# Patient Record
Sex: Male | Born: 1960
Health system: Southern US, Community
[De-identification: ages and names within clinical notes are randomized; demographics above are authoritative.]

## PROBLEM LIST (undated history)

## (undated) DIAGNOSIS — I10 Essential (primary) hypertension: Secondary | ICD-10-CM

## (undated) DIAGNOSIS — Z87442 Personal history of urinary calculi: Secondary | ICD-10-CM

## (undated) DIAGNOSIS — E785 Hyperlipidemia, unspecified: Secondary | ICD-10-CM

## (undated) DIAGNOSIS — M549 Dorsalgia, unspecified: Secondary | ICD-10-CM

## (undated) DIAGNOSIS — M25569 Pain in unspecified knee: Secondary | ICD-10-CM

## (undated) DIAGNOSIS — E669 Obesity, unspecified: Secondary | ICD-10-CM

## (undated) DIAGNOSIS — D229 Melanocytic nevi, unspecified: Secondary | ICD-10-CM

## (undated) HISTORY — PX: TONSILLECTOMY: SUR1361

## (undated) HISTORY — DX: Dorsalgia, unspecified: M54.9

## (undated) HISTORY — DX: Essential (primary) hypertension: I10

## (undated) HISTORY — DX: Pain in unspecified knee: M25.569

## (undated) HISTORY — DX: Obesity, unspecified: E66.9

## (undated) HISTORY — DX: Personal history of urinary calculi: Z87.442

## (undated) HISTORY — PX: OTHER SURGICAL HISTORY: SHX169

## (undated) HISTORY — DX: Hyperlipidemia, unspecified: E78.5

---
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
## 2001-02-27 ENCOUNTER — Ambulatory Visit (HOSPITAL_COMMUNITY): Admission: RE | Admit: 2001-02-27 | Discharge: 2001-02-27 | Payer: Self-pay | Admitting: Gastroenterology

## 2001-07-07 ENCOUNTER — Encounter: Payer: Self-pay | Admitting: Urology

## 2001-07-07 ENCOUNTER — Encounter: Admission: RE | Admit: 2001-07-07 | Discharge: 2001-07-07 | Payer: Self-pay | Admitting: Urology

## 2001-07-14 ENCOUNTER — Encounter: Admission: RE | Admit: 2001-07-14 | Discharge: 2001-07-14 | Payer: Self-pay | Admitting: Urology

## 2001-07-14 ENCOUNTER — Encounter: Payer: Self-pay | Admitting: Urology

## 2002-11-06 ENCOUNTER — Ambulatory Visit (HOSPITAL_COMMUNITY): Admission: AD | Admit: 2002-11-06 | Discharge: 2002-11-06 | Payer: Self-pay | Admitting: Orthopedic Surgery

## 2002-11-06 ENCOUNTER — Encounter: Payer: Self-pay | Admitting: Orthopedic Surgery

## 2002-12-07 ENCOUNTER — Ambulatory Visit (HOSPITAL_COMMUNITY): Admission: RE | Admit: 2002-12-07 | Discharge: 2002-12-07 | Payer: Self-pay | Admitting: Orthopedic Surgery

## 2003-07-26 DIAGNOSIS — D229 Melanocytic nevi, unspecified: Secondary | ICD-10-CM

## 2003-07-26 HISTORY — DX: Melanocytic nevi, unspecified: D22.9

## 2006-03-23 ENCOUNTER — Emergency Department (HOSPITAL_COMMUNITY): Admission: EM | Admit: 2006-03-23 | Discharge: 2006-03-24 | Payer: Self-pay | Admitting: Emergency Medicine

## 2007-01-02 ENCOUNTER — Ambulatory Visit: Payer: Self-pay | Admitting: Internal Medicine

## 2007-01-02 LAB — CONVERTED CEMR LAB
ALT: 31 units/L (ref 0–40)
AST: 20 units/L (ref 0–37)
Albumin: 3.9 g/dL (ref 3.5–5.2)
Alkaline Phosphatase: 38 units/L — ABNORMAL LOW (ref 39–117)
BUN: 11 mg/dL (ref 6–23)
Basophils Absolute: 0.1 10*3/uL (ref 0.0–0.1)
Basophils Relative: 0.8 % (ref 0.0–1.0)
Bilirubin, Direct: 0.1 mg/dL (ref 0.0–0.3)
CO2: 30 meq/L (ref 19–32)
CRP, High Sensitivity: 2 (ref 0.00–5.00)
Calcium: 9.3 mg/dL (ref 8.4–10.5)
Chloride: 109 meq/L (ref 96–112)
Cholesterol: 230 mg/dL (ref 0–200)
Creatinine, Ser: 0.9 mg/dL (ref 0.4–1.5)
Direct LDL: 171.5 mg/dL
Eosinophils Absolute: 0.2 10*3/uL (ref 0.0–0.6)
Eosinophils Relative: 3.7 % (ref 0.0–5.0)
GFR calc Af Amer: 116 mL/min
GFR calc non Af Amer: 96 mL/min
Glucose, Bld: 113 mg/dL — ABNORMAL HIGH (ref 70–99)
HCT: 41.3 % (ref 39.0–52.0)
HDL: 36.2 mg/dL — ABNORMAL LOW (ref >39.0)
Hemoglobin: 14.1 g/dL (ref 13.0–17.0)
Lymphocytes Relative: 29.1 % (ref 12.0–46.0)
MCHC: 34.2 g/dL (ref 30.0–36.0)
MCV: 85.6 fL (ref 78.0–100.0)
Monocytes Absolute: 0.5 10*3/uL (ref 0.2–0.7)
Monocytes Relative: 7.2 % (ref 3.0–11.0)
Neutro Abs: 3.7 10*3/uL (ref 1.4–7.7)
Neutrophils Relative %: 59.2 % (ref 43.0–77.0)
Platelets: 271 10*3/uL (ref 150–400)
Potassium: 4.6 meq/L (ref 3.5–5.1)
RBC: 4.83 M/uL (ref 4.22–5.81)
RDW: 12.7 % (ref 11.5–14.6)
Sodium: 145 meq/L (ref 135–145)
TSH: 1.22 microintl units/mL (ref 0.35–5.50)
Total Bilirubin: 0.7 mg/dL (ref 0.3–1.2)
Total CHOL/HDL Ratio: 6.4
Total Protein: 7.3 g/dL (ref 6.0–8.3)
Triglycerides: 138 mg/dL (ref 0–149)
VLDL: 28 mg/dL (ref 0–40)
WBC: 6.4 10*3/uL (ref 4.5–10.5)

## 2007-05-01 ENCOUNTER — Ambulatory Visit: Payer: Self-pay | Admitting: Internal Medicine

## 2007-05-01 LAB — CONVERTED CEMR LAB: Streptococcus, Group A Screen (Direct): NEGATIVE

## 2007-06-07 IMAGING — CT CT ABDOMEN W/O CM
2 of 3 series · 17 of 46 positions shown, 19 images · IV contrast (agent unspecified)
Comparison: none

CLINICAL DATA: Right lower quadrant pain. Rule out renal stone.
 ABDOMEN CT WITHOUT CONTRAST:
TECHNIQUE: Multidetector CT imaging of the abdomen was performed following the standard protocol without IV contrast.
TECHNIQUE: Multidetector CT imaging of the pelvis was performed following the standard protocol without IV contrast.

[Series 2: stone proto 5.0 b31f · axial · 0.79mm/px · z∈[+772,+1152]mm · 14 of 88 slices shown, 16 images]
[im 6/88  soft-tissue]
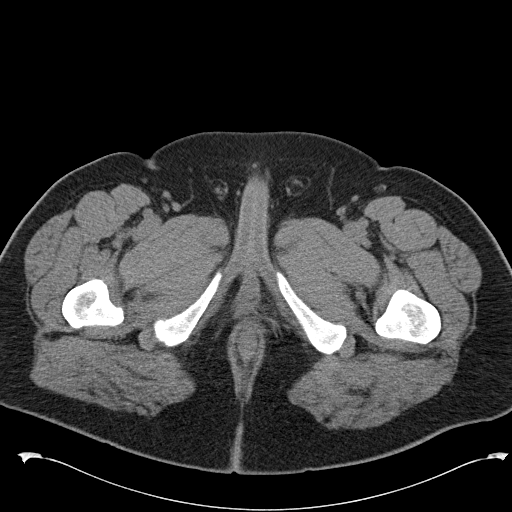
[im 6/88  bone]
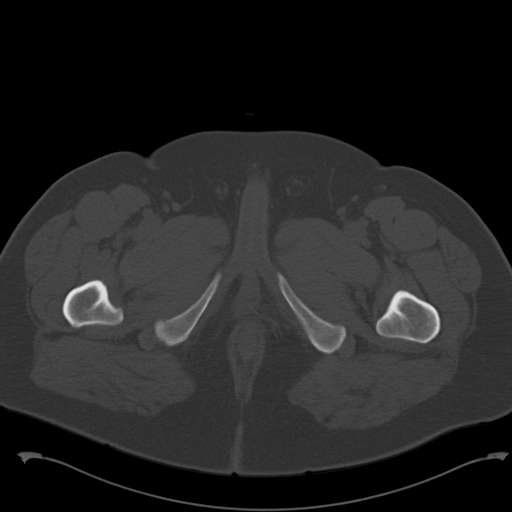
[im 12/88  soft-tissue]
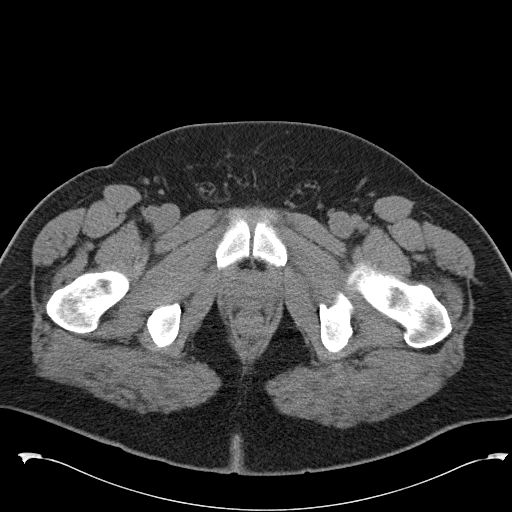
[im 17/88  soft-tissue]
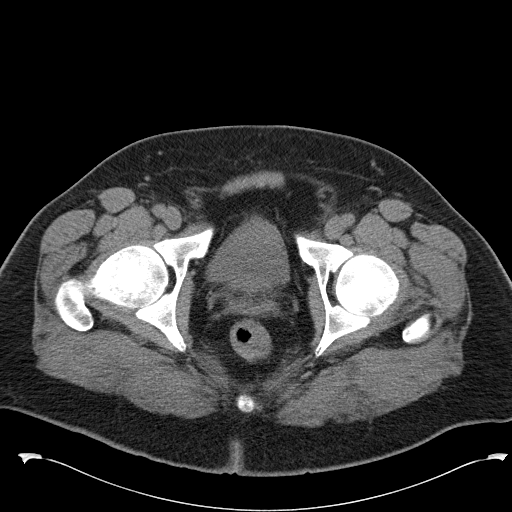
[im 23/88  soft-tissue]
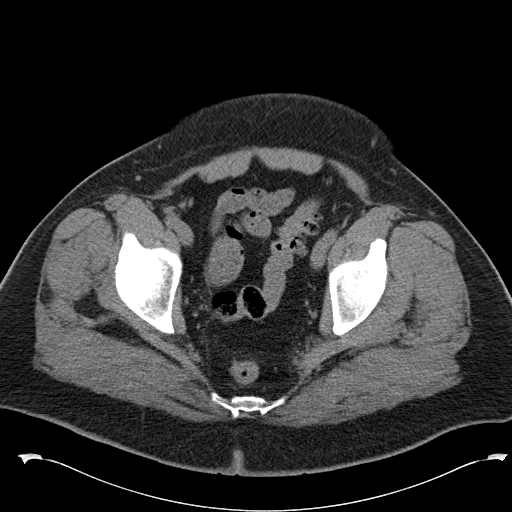
[im 29/88  soft-tissue]
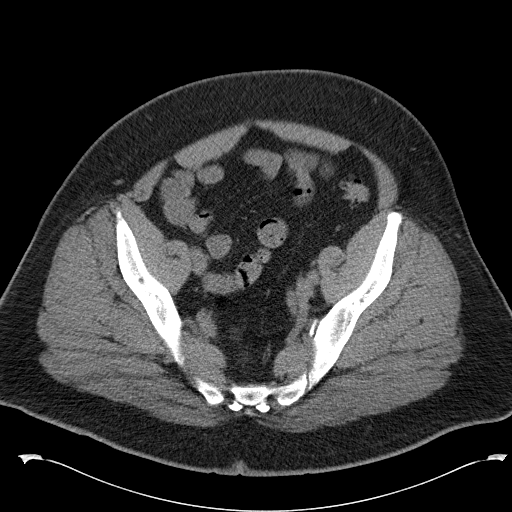
[im 34/88  soft-tissue]
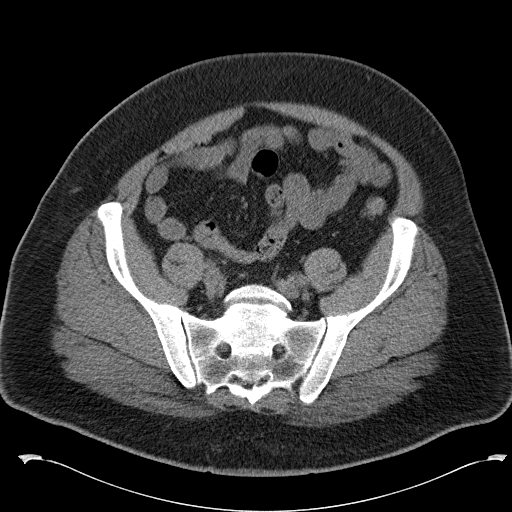
[im 40/88  soft-tissue]
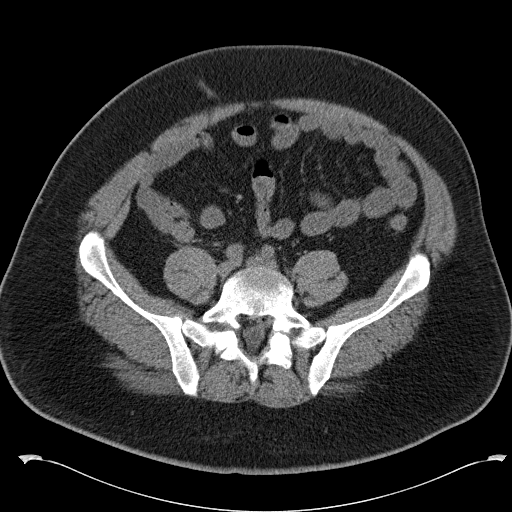
[im 48/88  soft-tissue]
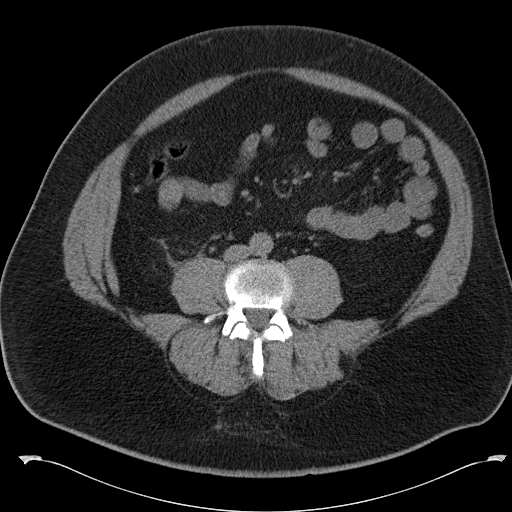
[im 54/88  soft-tissue]
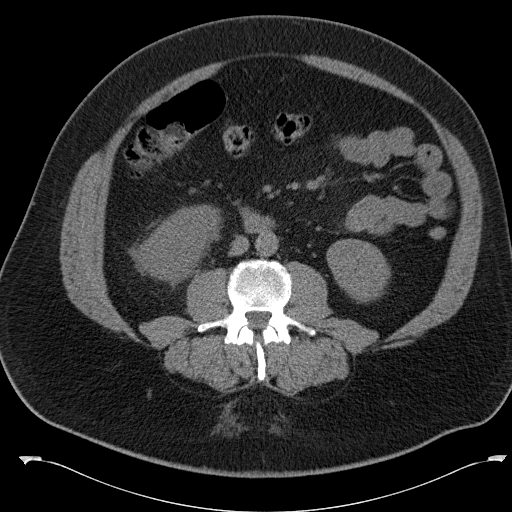
[im 54/88  bone]
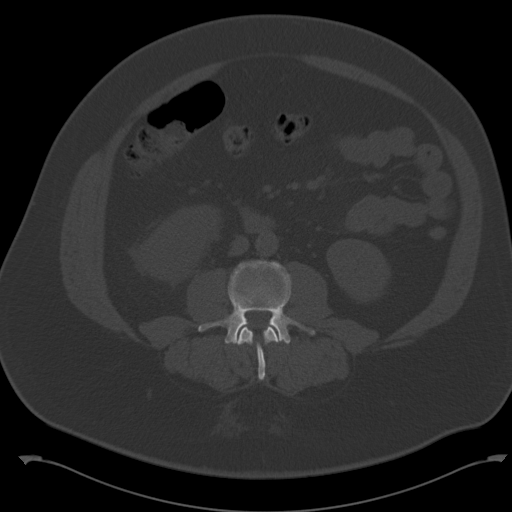
[im 59/88  soft-tissue]
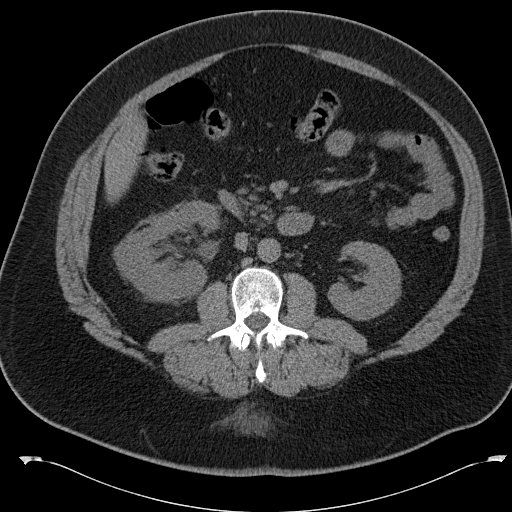
[im 65/88  soft-tissue]
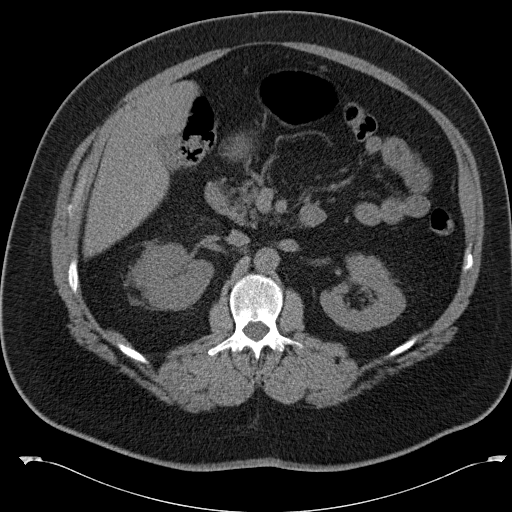
[im 71/88  soft-tissue]
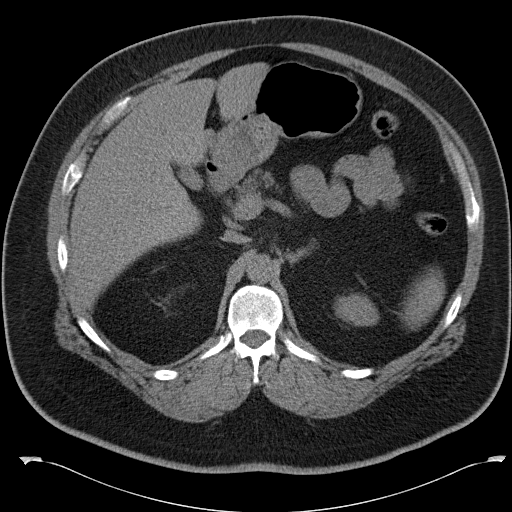
[im 76/88  soft-tissue]
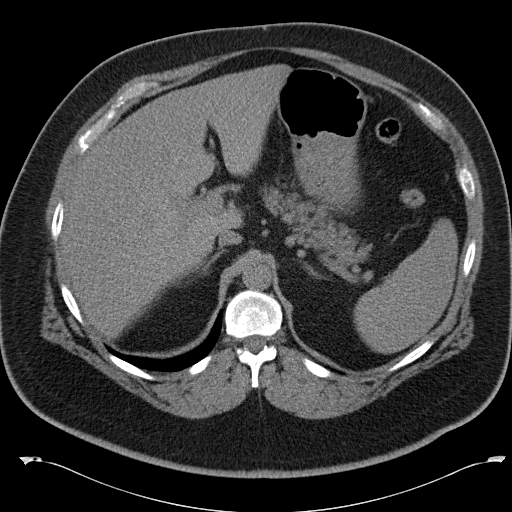
[im 82/88  soft-tissue]
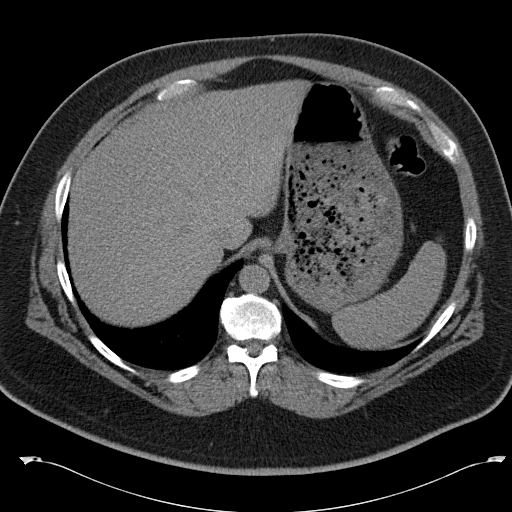

[Series 3: stone proto 2.0 spo cor · coronal · 0.85mm/px · 3 of 135 slices shown]
[im 45/135  soft-tissue]
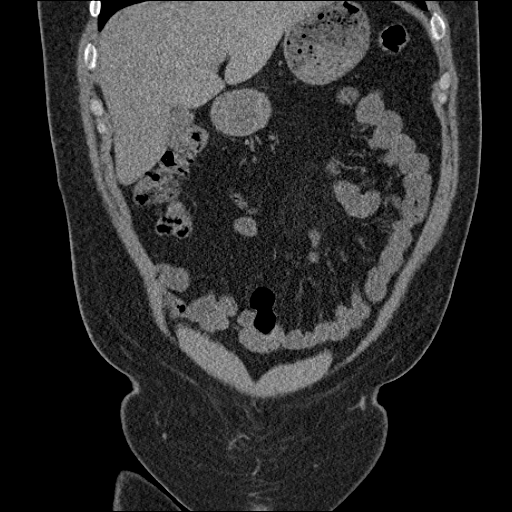
[im 60/135  soft-tissue]
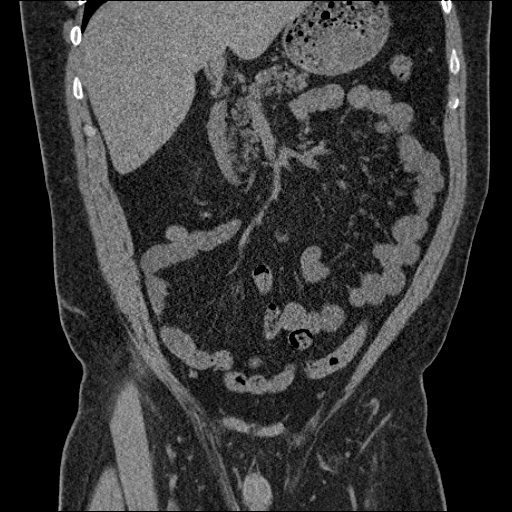
[im 75/135  soft-tissue]
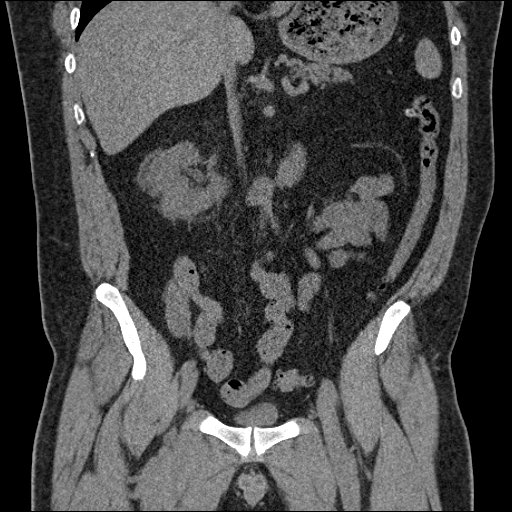

[17 of 46 positions shown; findings below may reference images not displayed]

FINDINGS: Lung bases clear.
FINDINGS: The liver is normal in attenuation and morphology.  Gallbladder negative. Pancreas is normal. The spleen is negative. The adrenal glands are negative.  The left kidney is normal. There is moderate right-sided hydronephrosis with an obstructing stone in the mid ureter. Stone measures 3.5 mm (image 46). 
 Tiny stone within the upper pole of the right kidney is also noted measuring approximately 1 mm.  
 No adenopathy.
 Visualized bowel loops are negative. Review of the bone windows is unremarkable.
IMPRESSION: Right-sided hydronephrosis with a small 3.5 mm stone in the mid right ureter. 
 PELVIS CT WITHOUT CONTRAST:
FINDINGS: Urinary bladder is normal.  Calcifications are seen within the pancreas centrally.  The pelvic bowel loops are significant for diverticula affecting the sigmoid colon. No active diverticulitis.
 Review of the bone windows shows no lytic or sclerotic findings.
IMPRESSION: No acute findings.

## 2009-10-04 DIAGNOSIS — M549 Dorsalgia, unspecified: Secondary | ICD-10-CM | POA: Insufficient documentation

## 2009-10-04 DIAGNOSIS — Z87442 Personal history of urinary calculi: Secondary | ICD-10-CM | POA: Insufficient documentation

## 2009-10-04 DIAGNOSIS — M25569 Pain in unspecified knee: Secondary | ICD-10-CM | POA: Insufficient documentation

## 2009-10-04 DIAGNOSIS — E785 Hyperlipidemia, unspecified: Secondary | ICD-10-CM | POA: Insufficient documentation

## 2009-10-05 ENCOUNTER — Ambulatory Visit: Payer: Self-pay | Admitting: Internal Medicine

## 2009-10-05 DIAGNOSIS — K219 Gastro-esophageal reflux disease without esophagitis: Secondary | ICD-10-CM | POA: Insufficient documentation

## 2009-10-05 LAB — CONVERTED CEMR LAB
ALT: 38 units/L (ref 0–53)
AST: 24 units/L (ref 0–37)
Albumin: 3.7 g/dL (ref 3.5–5.2)
Alkaline Phosphatase: 46 units/L (ref 39–117)
BUN: 8 mg/dL (ref 6–23)
Basophils Absolute: 0 10*3/uL (ref 0.0–0.1)
Basophils Relative: 0.7 % (ref 0.0–3.0)
Bilirubin Urine: NEGATIVE
Bilirubin, Direct: 0.1 mg/dL (ref 0.0–0.3)
CO2: 31 meq/L (ref 19–32)
CRP, High Sensitivity: 4.8 (ref 0.00–5.00)
Calcium: 9.4 mg/dL (ref 8.4–10.5)
Chloride: 106 meq/L (ref 96–112)
Cholesterol: 219 mg/dL — ABNORMAL HIGH (ref 0–200)
Creatinine, Ser: 0.9 mg/dL (ref 0.4–1.5)
Direct LDL: 170.1 mg/dL
Eosinophils Absolute: 0.2 10*3/uL (ref 0.0–0.7)
Eosinophils Relative: 3.5 % (ref 0.0–5.0)
GFR calc non Af Amer: 94.67 mL/min (ref >60)
Glucose, Bld: 109 mg/dL — ABNORMAL HIGH (ref 70–99)
HCT: 42.2 % (ref 39.0–52.0)
HDL: 34.7 mg/dL — ABNORMAL LOW (ref >39.00)
Hemoglobin, Urine: NEGATIVE
Hemoglobin: 13.9 g/dL (ref 13.0–17.0)
Ketones, ur: NEGATIVE mg/dL
Leukocytes, UA: NEGATIVE
Lymphocytes Relative: 26 % (ref 12.0–46.0)
Lymphs Abs: 1.5 10*3/uL (ref 0.7–4.0)
MCHC: 32.9 g/dL (ref 30.0–36.0)
MCV: 89.3 fL (ref 78.0–100.0)
Monocytes Absolute: 0.5 10*3/uL (ref 0.1–1.0)
Monocytes Relative: 8 % (ref 3.0–12.0)
Neutro Abs: 3.6 10*3/uL (ref 1.4–7.7)
Neutrophils Relative %: 61.8 % (ref 43.0–77.0)
Nitrite: NEGATIVE
Platelets: 242 10*3/uL (ref 150.0–400.0)
Potassium: 4.8 meq/L (ref 3.5–5.1)
RBC: 4.72 M/uL (ref 4.22–5.81)
RDW: 12.5 % (ref 11.5–14.6)
Sodium: 143 meq/L (ref 135–145)
Specific Gravity, Urine: 1.015 (ref 1.000–1.030)
TSH: 1.36 microintl units/mL (ref 0.35–5.50)
Total Bilirubin: 0.7 mg/dL (ref 0.3–1.2)
Total CHOL/HDL Ratio: 6
Total Protein, Urine: NEGATIVE mg/dL
Total Protein: 7.3 g/dL (ref 6.0–8.3)
Triglycerides: 94 mg/dL (ref 0.0–149.0)
Urine Glucose: NEGATIVE mg/dL
Urobilinogen, UA: 0.2 (ref 0.0–1.0)
VLDL: 18.8 mg/dL (ref 0.0–40.0)
WBC: 5.8 10*3/uL (ref 4.5–10.5)
pH: 7 (ref 5.0–8.0)

## 2009-10-13 ENCOUNTER — Telehealth: Payer: Self-pay | Admitting: Internal Medicine

## 2010-03-01 ENCOUNTER — Ambulatory Visit: Payer: Self-pay | Admitting: Internal Medicine

## 2010-03-01 ENCOUNTER — Inpatient Hospital Stay (HOSPITAL_COMMUNITY): Admission: AD | Admit: 2010-03-01 | Discharge: 2010-03-06 | Payer: Self-pay | Admitting: Specialist

## 2010-03-05 ENCOUNTER — Ambulatory Visit: Payer: Self-pay | Admitting: Infectious Diseases

## 2010-04-06 ENCOUNTER — Ambulatory Visit: Payer: Self-pay | Admitting: Internal Medicine

## 2010-06-06 ENCOUNTER — Encounter: Payer: Self-pay | Admitting: Internal Medicine

## 2010-06-06 ENCOUNTER — Encounter: Admission: RE | Admit: 2010-06-06 | Discharge: 2010-07-05 | Payer: Self-pay | Admitting: Internal Medicine

## 2010-10-11 ENCOUNTER — Ambulatory Visit
Admission: RE | Admit: 2010-10-11 | Discharge: 2010-10-11 | Payer: Self-pay | Source: Home / Self Care | Attending: Internal Medicine | Admitting: Internal Medicine

## 2010-10-11 ENCOUNTER — Other Ambulatory Visit: Payer: Self-pay | Admitting: Internal Medicine

## 2010-10-11 ENCOUNTER — Encounter: Payer: Self-pay | Admitting: Internal Medicine

## 2010-10-11 LAB — CBC WITH DIFFERENTIAL/PLATELET
Basophils Absolute: 0.1 10*3/uL (ref 0.0–0.1)
Basophils Relative: 1.1 % (ref 0.0–3.0)
Eosinophils Absolute: 0.2 10*3/uL (ref 0.0–0.7)
Eosinophils Relative: 3.4 % (ref 0.0–5.0)
HCT: 41 % (ref 39.0–52.0)
Hemoglobin: 13.9 g/dL (ref 13.0–17.0)
Lymphocytes Relative: 27.7 % (ref 12.0–46.0)
Lymphs Abs: 1.7 10*3/uL (ref 0.7–4.0)
MCHC: 33.9 g/dL (ref 30.0–36.0)
MCV: 87.9 fl (ref 78.0–100.0)
Monocytes Absolute: 0.5 10*3/uL (ref 0.1–1.0)
Monocytes Relative: 8 % (ref 3.0–12.0)
Neutro Abs: 3.7 10*3/uL (ref 1.4–7.7)
Neutrophils Relative %: 59.8 % (ref 43.0–77.0)
Platelets: 244 10*3/uL (ref 150.0–400.0)
RBC: 4.66 Mil/uL (ref 4.22–5.81)
RDW: 13.4 % (ref 11.5–14.6)
WBC: 6.2 10*3/uL (ref 4.5–10.5)

## 2010-10-11 LAB — URINALYSIS
Bilirubin Urine: NEGATIVE
Ketones, ur: NEGATIVE
Leukocytes, UA: NEGATIVE
Nitrite: NEGATIVE
Specific Gravity, Urine: 1.025 (ref 1.000–1.030)
Total Protein, Urine: NEGATIVE
Urine Glucose: NEGATIVE
Urobilinogen, UA: 0.2 (ref 0.0–1.0)
pH: 6 (ref 5.0–8.0)

## 2010-10-11 LAB — LIPID PANEL
Cholesterol: 205 mg/dL — ABNORMAL HIGH (ref 0–200)
HDL: 32.6 mg/dL — ABNORMAL LOW (ref >39.00)
Total CHOL/HDL Ratio: 6
Triglycerides: 91 mg/dL (ref 0.0–149.0)
VLDL: 18.2 mg/dL (ref 0.0–40.0)

## 2010-10-11 LAB — BASIC METABOLIC PANEL
BUN: 17 mg/dL (ref 6–23)
CO2: 27 mEq/L (ref 19–32)
Calcium: 9.3 mg/dL (ref 8.4–10.5)
Chloride: 104 mEq/L (ref 96–112)
Creatinine, Ser: 0.9 mg/dL (ref 0.4–1.5)
GFR: 91.93 mL/min (ref >60.00)
Glucose, Bld: 95 mg/dL (ref 70–99)
Potassium: 4.7 mEq/L (ref 3.5–5.1)
Sodium: 139 mEq/L (ref 135–145)

## 2010-10-11 LAB — LDL CHOLESTEROL, DIRECT: Direct LDL: 165.1 mg/dL

## 2010-10-11 LAB — HIGH SENSITIVITY CRP: CRP, High Sensitivity: 12.91 mg/L — ABNORMAL HIGH (ref 0.00–5.00)

## 2010-10-11 LAB — HEPATIC FUNCTION PANEL
ALT: 23 U/L (ref 0–53)
AST: 23 U/L (ref 0–37)
Albumin: 3.7 g/dL (ref 3.5–5.2)
Alkaline Phosphatase: 46 U/L (ref 39–117)
Bilirubin, Direct: 0.2 mg/dL (ref 0.0–0.3)
Total Bilirubin: 0.8 mg/dL (ref 0.3–1.2)
Total Protein: 7 g/dL (ref 6.0–8.3)

## 2010-10-11 LAB — TSH: TSH: 1.41 u[IU]/mL (ref 0.35–5.50)

## 2010-11-06 NOTE — Assessment & Plan Note (Signed)
Summary: knee & calf swollen/apc   Primary Provider/Referring Provider:  Dr. Sherene Sires  CC:  edema in right knee and calf with redness and pain x6days - pt states he was crawling around last week for work.  History of Present Illness: 50 yowm never smoker with morbid obesity  October 05, 2009 cpx no specific complaints  Mar 01, 2010--Presents for an acute office visit. Complains of edema in right knee and calf with redness and pain x6days - pt states he was crawling around last week for work.  Was seen at [facility] 3 days ago, fluid (pus)  drawn off knee. 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 Seen by [facility] started on Augmentin and fluid removed. Knee is still very swollen and red. Sore to touch. He is working but not bending or crawling around for last 3 days. Had initial body aches, chills, that stopped after on abx for 2 days. Has low grade temp today in office. Was suppose to be referred to ortho 2 days ago but says [facility] has not made referral yet and he is very worried about knee that the swelling is getting worse and now lower leg is swollen.  Denies chest pain, dyspnea, orthopnea, hemoptysis,  n/v/d, headache, rash.   Preventive Screening-Counseling & Management  Alcohol-Tobacco     Smoking Status: never  Medications Prior to Update: 1)  Glucosamine-Chondroitin  Caps (Glucosamine-Chondroit-Vit C-Mn) .Marland Kitchen.. 1 Once Daily 2)  Prilosec Otc 20 Mg Tbec (Omeprazole Magnesium) .Marland Kitchen.. 1 Once Daily 3)  Multivitamins  Tabs (Multiple Vitamin) .Marland Kitchen.. 1 Once Daily  Current Medications (verified): 1)  Glucosamine-Chondroitin  Caps (Glucosamine-Chondroit-Vit C-Mn) .Marland Kitchen.. 1 Once Daily 2)  Prilosec Otc 20 Mg Tbec (Omeprazole Magnesium) .Marland Kitchen.. 1 Once Daily 3)  Multivitamins  Tabs (Multiple Vitamin) .Marland Kitchen.. 1 Once Daily  Allergies (verified): 1)  ! Codeine 2)  ! Neosporin  Past History:  Past  Medical History: Last updated: 10/05/2009 KNEE PAIN, CHRONIC (ICD-719.46) BACK PAIN, CHRONIC (ICD-724.5) NEPHROLITHIASIS, HX OF (ICD-V13.01) OBESITY (ICD-278.00)    - Target wt  =   190 for BMI < 30  HYPERLIPIDEMIA (ICD-272.4)     - Target < 160 LDL with only risk factor male gender Diverticulosis...........................................Marland KitchenEdwards     - Colonoscopy 04/04/2006 Health Maintenance.................................Marland KitchenWert     - Td 12/2006     - CPX October 05, 2009   Family History: Last updated: 10/04/2009 Alcoholism/Cirrhosis- Father DM- Mother and Father Brain CA- Father  Social History: Last updated: 10/05/2009 [place] Never smoker No ETOH  Social History: Smoking Status:  never  Review of Systems      See HPI  Vital Signs:  Patient profile:   50 year old male Height:      67 inches Weight:      261 pounds BMI:     41.03 O2 Sat:      98 % on Room air Temp:     99.4 degrees F oral Pulse rate:   76 / minute BP sitting:   116 / 78  (left arm) Cuff size:   large  Vitals Entered By: Boone Master CNA/MA (Mar 01, 2010 2:49 PM)  O2 Flow:  Room air CC: edema in right knee and calf with redness and pain x6days - pt states he was crawling around last week for work Is Patient Diabetic? No Comments Medications reviewed with patient Daytime contact number verified with patient.  Boone Master CNA/MA  Mar 01, 2010 2:49 PM    Physical Exam  Additional Exam:  wt 264 October 05, 2009 >>261 Mar 01, 2010 2 obese amb wm nad HEENT: upper dentures and lower partials, nl turbinates, and orophanx. Nl external ear canals without cough reflex NECK :  without JVD/Nodes/TM/ nl carotid upstrokes bilaterally LUNGS: no acc muscle use, clear to A and P bilaterally without cough on insp or exp maneuvers CV:  RRR  no s3 or murmur or increase in P2, no edema  ABD:  soft and nontender with nl excursion in the supine position. No bruits or organomegaly, bowel sounds nl MS:   Right knee swollen w/ surrounding erthema, hot to touch, swelling along lower leg. pulses intact , no rash, neg homans sign.  SKIN: warm and dry without lesions   NEURO:  alert, approp, no deficits     Impression & Recommendations:  Problem # 1:  KNEE PAIN, ACUTE (ICD-719.46) Acute knee swelling/redness w/ underlying knee effusion s/p tap suspect infected.  no records from [facility] available ? unclear if cx were sent.  No significant improvement w/ 3 days of abx.  Will refer to ortho asap.  [facility] has agreed to see pt right away for evaluation /tx.  pt aware and agrees to office visit w/ ortho.  REC:  Fisnish Augmentin Wash knee gently w/ soap/water  Keep leg elevated  We are referring you to Ortho to evaluate your knee.  Please contact office for sooner follow up if symptoms do not improve or worsen   Orders: Est. Patient Level IV (16109)  Complete Medication List: 1)  Glucosamine-chondroitin Caps (Glucosamine-chondroit-vit c-mn) .Marland Kitchen.. 1 once daily 2)  Prilosec Otc 20 Mg Tbec (Omeprazole magnesium) .Marland Kitchen.. 1 once daily 3)  Multivitamins Tabs (Multiple vitamin) .Marland Kitchen.. 1 once daily  Other Orders: Orthopedic Referral (Ortho)  Patient Instructions: 1)  Fisnish Augmentin 2)  Wash knee gently w/ soap/water  3)  Keep leg elevated  4)  We are referring you to Ortho to evaluate your knee.  5)  Please contact office for sooner follow up if symptoms do not improve or worsen

## 2010-11-06 NOTE — Progress Notes (Signed)
Summary: results  Phone Note Call from Patient Call back at [phone]   Caller: Patient Call For: Anshika Pethtel Summary of Call: calling results from cpx Initial call taken by: Rickard Patience,  October 13, 2009 3:19 PM  Follow-up for Phone Call        pt advised per append to lab results. Carron Curie CMA  October 13, 2009 3:33 PM

## 2010-11-06 NOTE — Letter (Signed)
Summary: [employer]  [employer]   Imported By: Lester Ferguson 06/19/2010 08:45:09  _____________________________________________________________________  External Attachment:    Type:   Image     Comment:   External Document

## 2010-11-06 NOTE — Assessment & Plan Note (Signed)
Summary: Primary svc/ f/u ov, refer to nutrition   Primary Provider/Referring Provider:  Dr. Sherene Sires  CC:  6 month followup.  Pt states no complaints today.Marland Kitchen  History of Present Illness: 45  yowm never smoker with morbid obesity  October 05, 2009 cpx no specific complaints  Mar 01, 2010--Presents for an acute office visit. Complains of edema in right knee and calf with redness and pain x6days - pt states he was crawling around last week for work.  Was seen at [facility] 3 days ago, fluid (pus)  drawn off knee. 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 Seen by [facility] started on Augmentin and fluid removed. Knee is still very swollen and red. Sore to touch. He is working but not bending or crawling around for last 3 days. Had initial body aches, chills, that stopped after on abx for 2 days. Has low grade temp today in office. Was suppose to be referred to ortho 2 days ago but says [facility] has not made referral yet and he is very worried about knee that the swelling is getting worse and now lower leg is swollen.  rec ortho eval > admitted [facility]  April 06, 2010 6 month followup.  Pt states no complaints today. 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 no pain in knee, no fever or leg swelling.  Pt denies any significant sore throat, dysphagia, itching, sneezing,  nasal congestion or excess secretions,  chills, sweats, unintended wt loss, pleuritic or exertional cp, hempoptysis, change in activity tolerance  orthopnea pnd.    Current Medications (verified): 1)  Glucosamine-Chondroitin  Caps (Glucosamine-Chondroit-Vit C-Mn) .Marland Kitchen.. 1 Once Daily 2)  Prilosec Otc 20 Mg Tbec (Omeprazole Magnesium) .Marland Kitchen.. 1 Once Daily 3)  Multivitamins  Tabs (Multiple Vitamin) .Marland Kitchen.. 1 Once Daily 4)  Aspirin 325 Mg Tabs (Aspirin) .Marland Kitchen.. 1 Once Daily 5)  Vitamin C 500 Mg Tabs (Ascorbic Acid) .Marland Kitchen..  1 Once Daily  Allergies (verified): 1)  ! Codeine 2)  ! Neosporin  Past History:  Past Medical History: KNEE PAIN, CHRONIC (ICD-719.46)............................Marland KitchenBeane BACK PAIN, CHRONIC (ICD-724.5) NEPHROLITHIASIS, HX OF (ICD-V13.01) OBESITY (ICD-278.00)    - Target wt  =   190 for BMI < 30     - Refer to nutrition April 06, 2010  HYPERLIPIDEMIA (ICD-272.4)     - Target < 160 LDL with only risk factor male gender Diverticulosis...........................................Marland KitchenEdwards     - Colonoscopy 04/04/2006 Health Maintenance.................................Marland KitchenWert     - Td 12/2006     - CPX October 05, 2009   Vital Signs:  Patient profile:   50 year old male Weight:      258 pounds O2 Sat:      98 % on Room air Temp:     98.4 degrees F oral Pulse rate:   99 / minute BP sitting:   132 / 84  (left arm) Cuff size:   large  Vitals Entered By: Vernie Murders (April 06, 2010 9:43 AM)  O2 Flow:  Room air  Physical Exam  Additional Exam:  wt 264 October 05, 2009 >>261 Mar 01, 2010 > 258 April 06, 2010  obese amb wm nad HEENT: upper dentures and lower partials, nl turbinates, and orophanx. Nl external ear canals without cough reflex NECK :  without JVD/Nodes/TM/ nl carotid upstrokes bilaterally LUNGS: no acc muscle use,  clear to A and P bilaterally without cough on insp or exp maneuvers CV:  RRR  no s3 or murmur or increase in P2, no edema  ABD:  soft and nontender with nl excursion in the supine position. No bruits or organomegaly, bowel sounds nl MS:  Right knee nl exam.  SKIN: warm and dry without lesions  - right knee wound looks great    Impression & Recommendations:  Problem # 1:  KNEE PAIN, ACUTE (ICD-719.46)  resolved, f/u Dr Shelle Iron prn  Orders: Est. Patient Level III (16109)  Problem # 2:  GERD (ICD-530.81)  His updated medication list for this problem includes:    Prilosec Otc 20 Mg Tbec (Omeprazole magnesium) .Marland Kitchen... 1 once daily  Orders: Est. Patient  Level III (60454)  Problem # 3:  OBESITY (ICD-278.00)  Orders: Nutrition Referral (Nutrition) Est. Patient Level III (09811)   Weight control is a matter of calorie balance which needs to be tilted in the pt's favor by eating less and exercising more.  Specifically, I recommended  exercise at a level where pt  is short of breath but not out of breath 30 minutes daily. AT this point, esp since wife has same problem, I would strongly recommend  they  see a nutritionist with a food diary recorded for two weeks prior to the visit.     Medications Added to Medication List This Visit: 1)  Aspirin 325 Mg Tabs (Aspirin) .Marland Kitchen.. 1 once daily 2)  Vitamin C 500 Mg Tabs (Ascorbic acid) .Marland Kitchen.. 1 once daily  Patient Instructions: 1)  See Patient Care Coordinator before leaving for nutrition eval with a food diary for two weeks 2)  CPX Due after Jan 1

## 2010-11-08 NOTE — Assessment & Plan Note (Signed)
Summary: Primary Svc/ cpx     Primary Provider/Referring Provider:  Dr. Sherene Sires  CC:  cpx fasting.  History of Present Illness: 1  yowm never smoker with morbid obesity  October 05, 2009 cpx no specific complaints  Mar 01, 2010--Presents for an acute office visit. Complains of edema in right knee and calf with redness and pain x6days - referred to Dr Paula Libra for infected bursitis > required surgical drainage 5/27  October 11, 2010 cpx knee all better, no luck with wt loss, nocturia x 4 but drinking lots of fluids, no dysuria or hesitance. Pt denies any significant sore throat, dysphagia, itching, sneezing,  nasal congestion or excess secretions,  fever, chills, sweats, unintended wt loss, pleuritic or exertional cp, hempoptysis, change in activity tolerance  orthopnea pnd or leg swelling.  Current Medications (verified): 1)  Glucosamine-Chondroitin  Caps (Glucosamine-Chondroit-Vit C-Mn) .Marland Kitchen.. 1 Once Daily 2)  Prilosec Otc 20 Mg Tbec (Omeprazole Magnesium) .Marland Kitchen.. 1 Once Daily As Needed  Allergies (verified): 1)  ! Codeine 2)  ! Neosporin  Past History:  Past Medical History: KNEE PAIN, CHRONIC (ICD-719.46)............................Marland KitchenBeane     - R knee surgery Mar 02 2010 BACK PAIN, CHRONIC (ICD-724.5) NEPHROLITHIASIS, HX OF (ICD-V13.01) OBESITY (ICD-278.00)    - Target wt  =   190 for BMI < 30     -  nutrition evaluated 06/06/10 HYPERLIPIDEMIA (ICD-272.4)     - Target < 160 LDL with only risk factor male gender Diverticulosis...........................................Marland KitchenEdwards     - Colonoscopy 04/04/2006 Health Maintenance.................................Marland KitchenWert     - Td 12/2006     - CPX October 11, 2010   Past Surgical History: Mar 02 2010:   1. Irrigation and debridement, open, of infected prepatellar bursa.   2. Excision of prepatellar burs  Family History: Alcoholism/Cirrhosis- Father DM- Mother and Father Brain CA- Father no premature ascvd  Social  History: Psychologist, occupational active Never smoker No ETOH  Vital Signs:  Patient profile:   50 year old male Weight:      259 pounds O2 Sat:      99 % on Room air Temp:     97.7 degrees F oral Pulse rate:   64 / minute BP sitting:   136 / 80  (left arm) Cuff size:   large  Vitals Entered By: Vernie Murders (October 11, 2010 8:51 AM)  O2 Flow:  Room air  Physical Exam  Additional Exam:  wt 264 October 05, 2009 >>261 Mar 01, 2010 > 258 April 06, 2010 > 259 October 11, 2010  obese amb wm nad HEENT: upper dentures and lower partials, nl turbinates, and orophanx. Nl external ear canals without cough reflex NECK :  without JVD/Nodes/TM/ nl carotid upstrokes bilaterally LUNGS: no acc muscle use, clear to A and P bilaterally without cough on insp or exp maneuvers CV:  RRR  no s3 or murmur or increase in P2, no edema  ABD:  soft and nontender with nl excursion in the supine position. No bruits or organomegaly, bowel sounds nl MS:  Right knee nl exam.  SKIN: warm and dry without lesions   GU testes down bilaterally with no nodules or IH Rectal minimal bph, stool g neg    Cholesterol          [H]  205 mg/dL                   2-952     ATP III Classification  Desirable:  < 200 mg/dL                    Borderline High:  200 - 239 mg/dL               High:  > = 240 mg/dL   Triglycerides             91.0 mg/dL                  1.6-109.6     Normal:  <150 mg/dL     Borderline High:  045 - 199 mg/dL   HDL                  [L]  40.98 mg/dL                 >11.91   VLDL Cholesterol          18.2 mg/dL                  4.7-82.9  CHO/HDL Ratio:  CHD Risk                             6                    Men          Women     1/2 Average Risk     3.4          3.3     Average Risk          5.0          4.4     2X Average Risk          9.6          7.1     3X Average Risk          15.0          11.0                           Tests: (2) BMP (METABOL)   Sodium                    139 mEq/L                    135-145   Potassium                 4.7 mEq/L                   3.5-5.1   Chloride                  104 mEq/L                   96-112   Carbon Dioxide            27 mEq/L                    19-32   Glucose                   95 mg/dL                    56-21   BUN  17 mg/dL                    1-61   Creatinine                0.9 mg/dL                   0.9-6.0   Calcium                   9.3 mg/dL                   4.5-40.9   GFR                       91.93 mL/min                >60.00  Tests: (3) CBC Platelet w/Diff (CBCD)   White Cell Count          6.2 K/uL                    4.5-10.5   Red Cell Count            4.66 Mil/uL                 4.22-5.81   Hemoglobin                13.9 g/dL                   81.1-91.4   Hematocrit                41.0 %                      39.0-52.0   MCV                       87.9 fl                     78.0-100.0   MCHC                      33.9 g/dL                   78.2-95.6   RDW                       13.4 %                      11.5-14.6   Platelet Count            244.0 K/uL                  150.0-400.0   Neutrophil %              59.8 %                      43.0-77.0   Lymphocyte %              27.7 %                      12.0-46.0   Monocyte %                8.0 %  3.0-12.0   Eosinophils%              3.4 %                       0.0-5.0   Basophils %               1.1 %                       0.0-3.0   Neutrophill Absolute      3.7 K/uL                    1.4-7.7   Lymphocyte Absolute       1.7 K/uL                    0.7-4.0   Monocyte Absolute         0.5 K/uL                    0.1-1.0  Eosinophils, Absolute                             0.2 K/uL                    0.0-0.7   Basophils Absolute        0.1 K/uL                    0.0-0.1  Tests: (4) TSH (TSH)   FastTSH                   1.41 uIU/mL                 0.35-5.50  Tests: (5) Hepatic/Liver Function Panel (HEPATIC)   Total  Bilirubin           0.8 mg/dL                   1.6-1.0   Direct Bilirubin          0.2 mg/dL                   9.6-0.4   Alkaline Phosphatase      46 U/L                      39-117   AST                       23 U/L                      0-37   ALT                       23 U/L                      0-53   Total Protein             7.0 g/dL                    5.4-0.9   Albumin                   3.7 g/dL  3.5-5.2  Tests: (6) Full Range CRP (FCRP)   CRPH                 [H]  12.91 mg/L                  0.00-5.00     Note:  An elevated hs-CRP (>5 mg/L) should be repeated after 2 weeks to rule out recent infection or trauma.  Tests: (7) UDip Only (UDIP)   Color                     LT. YELLOW       RANGE:  Yellow;Lt. Yellow   Clarity                   CLEAR                       Clear   Specific Gravity          1.025                       1.000 - 1.030   Urine Ph                  6.0                         5.0-8.0   Protein                   NEGATIVE                    Negative   Urine Glucose             NEGATIVE                    Negative   Ketones                   NEGATIVE                    Negative   Urine Bilirubin           NEGATIVE                    Negative   Blood                     TRACE-LYSED                 Negative   Urobilinogen              0.2                         0.0 - 1.0   Leukocyte Esterace        NEGATIVE                    Negative   Nitrite                   NEGATIVE                    Negative  Tests: (8) Cholesterol LDL - Direct (DIRLDL)  Cholesterol LDL - Direct  165.1 mg/dL  Impression & Recommendations:  Problem # 1:  HYPERLIPIDEMIA (ICD-272.4) LDL 170 > 165 despite nutrition rx.   target LDL < 160 ,  note elevated CRP so could argue should be < 130 with RF male with pos crp  Problem # 2:  OBESITY (ICD-278.00)  Weight control is a matter of calorie balance which needs to be tilted in the pt's favor by  eating less and exercising more.  Specifically, I recommended  exercise at a level where pt  is short of breath but not out of breath 30 minutes daily.  If not losing weight on this program, I would strongly recommend pt see a nutritionist again with a new food diary recorded for two weeks prior to the visit.     Medications Added to Medication List This Visit: 1)  Prilosec Otc 20 Mg Tbec (Omeprazole magnesium) .Marland Kitchen.. 1 once daily as needed  Other Orders: EKG w/ Interpretation (93000) Est. Patient 40-64 years (16109) TLB-Lipid Panel (80061-LIPID) TLB-BMP (Basic Metabolic Panel-BMET) (80048-METABOL) TLB-CBC Platelet - w/Differential (85025-CBCD) TLB-TSH (Thyroid Stimulating Hormone) (84443-TSH) TLB-Hepatic/Liver Function Pnl (80076-HEPATIC) TLB-CRP-High Sensitivity (C-Reactive Protein) (86140-FCRP) TLB-Udip ONLY (81003-UDIP)  Patient Instructions: 1)  Weight control is simply a matter of calorie balance which needs to be tilted in your favor by eating less and exercising more.  To get the most out of exercise, you need to be continuously aware that you are short of breath, but never out of breath, for 30 minutes daily. As you improve, it will actually be easier for you to do the same amount in  30 minutes so always push to the level where you are short of breath.  If this does not result in gradual weight reduction,  I recommend  a nutritionist for a food diary 2)  Call [phone] for your results w/in next 3 days - if there's something important  I feel you need to know,  I'll be in touch with you directly.  3)  Return for yearly exam, sooner if needed

## 2010-12-24 LAB — BASIC METABOLIC PANEL
BUN: 12 mg/dL (ref 6–23)
CO2: 24 mEq/L (ref 19–32)
Calcium: 9.3 mg/dL (ref 8.4–10.5)
Chloride: 107 mEq/L (ref 96–112)
Creatinine, Ser: 0.84 mg/dL (ref 0.4–1.5)
GFR calc Af Amer: >60 mL/min (ref >60)
GFR calc non Af Amer: >60 mL/min (ref >60)
Glucose, Bld: 95 mg/dL (ref 70–99)
Potassium: 3.9 mEq/L (ref 3.5–5.1)
Sodium: 137 mEq/L (ref 135–145)

## 2010-12-24 LAB — CBC
HCT: 37 % — ABNORMAL LOW (ref 39.0–52.0)
HCT: 38.6 % — ABNORMAL LOW (ref 39.0–52.0)
HCT: 40.3 % (ref 39.0–52.0)
Hemoglobin: 12.8 g/dL — ABNORMAL LOW (ref 13.0–17.0)
Hemoglobin: 13.4 g/dL (ref 13.0–17.0)
MCHC: 33 g/dL (ref 30.0–36.0)
MCHC: 33 g/dL (ref 30.0–36.0)
MCHC: 33.1 g/dL (ref 30.0–36.0)
MCHC: 33.2 g/dL (ref 30.0–36.0)
MCV: 88.2 fL (ref 78.0–100.0)
MCV: 88.3 fL (ref 78.0–100.0)
MCV: 88.3 fL (ref 78.0–100.0)
MCV: 88.4 fL (ref 78.0–100.0)
MCV: 88.6 fL (ref 78.0–100.0)
Platelets: 272 10*3/uL (ref 150–400)
Platelets: 284 10*3/uL (ref 150–400)
RBC: 4.07 MIL/uL — ABNORMAL LOW (ref 4.22–5.81)
RBC: 4.19 MIL/uL — ABNORMAL LOW (ref 4.22–5.81)
RBC: 4.37 MIL/uL (ref 4.22–5.81)
RBC: 4.57 MIL/uL (ref 4.22–5.81)
RDW: 13 % (ref 11.5–15.5)
RDW: 13 % (ref 11.5–15.5)
RDW: 13.3 % (ref 11.5–15.5)
WBC: 13.5 10*3/uL — ABNORMAL HIGH (ref 4.0–10.5)
WBC: 8.7 10*3/uL (ref 4.0–10.5)

## 2010-12-24 LAB — BODY FLUID CULTURE

## 2010-12-24 LAB — ANAEROBIC CULTURE

## 2010-12-24 LAB — DIFFERENTIAL
Basophils Absolute: 0.2 10*3/uL — ABNORMAL HIGH (ref 0.0–0.1)
Eosinophils Relative: 3 % (ref 0–5)
Lymphocytes Relative: 17 % (ref 12–46)
Lymphs Abs: 1.5 10*3/uL (ref 0.7–4.0)
Neutro Abs: 6.1 10*3/uL (ref 1.7–7.7)

## 2010-12-24 LAB — HEPATIC FUNCTION PANEL
ALT: 20 U/L (ref 0–53)
AST: 16 U/L (ref 0–37)
Albumin: 3 g/dL — ABNORMAL LOW (ref 3.5–5.2)
Total Protein: 7.4 g/dL (ref 6.0–8.3)

## 2010-12-24 LAB — SEDIMENTATION RATE: Sed Rate: 103 mm/hr — ABNORMAL HIGH (ref 0–16)

## 2010-12-24 LAB — C-REACTIVE PROTEIN: CRP: 14.7 mg/dL — ABNORMAL HIGH (ref <0.6)

## 2011-02-19 NOTE — Assessment & Plan Note (Signed)
[facility]                             PULMONARY OFFICE NOTE   Terry Cummings, Terry Cummings                       MRN:          [id number]  DATE:05/01/2007                            DOB:          [birth year]    HISTORY OF PRESENT ILLNESS:  Patient is a 50 year old white male patient  of Dr. Thurston Hole who has a known history of morbid obesity, hyperlipidemia  and gastroesophageal reflux.  Presents today for a 3-day history of sore  throat, nasal congestion and nasal drip.  Patient denies any purulent  sputum, fever, chest pain, recent travel or antibiotic use.   PAST MEDICAL HISTORY:  Reviewed.   CURRENT MEDICATIONS:  Reviewed.   PHYSICAL EXAMINATION:  Patient is a pleasant male in no acute distress.  He is afebrile with stable vital signs.  O2 saturation is 99% on room  air.  HEENT:  Nasal mucosa is slightly pale.  Nontender sinuses.  Conjunctivae  not injected.  TMs are normal.  Posterior pharynx with some mild  erythema, no exudate is noted.  NECK:  Supple without adenopathy.  LUNGS:  Sounds are clear.  CARDIAC:  Regular rate.  ABDOMEN:  Morbidly obese and soft.  EXTREMITIES:  Warm without any edema.   IMPRESSION AND PLAN:  Acute pharyngitis with rhinitis flare, strep test  is pending.  Patient is to use salt water gargles, Magic Mouthwash as  needed.  Associated-in Zyrtec 10 mg at bedtime as needed for postnasal  drip symptoms and may use a nasal hygiene regimen with saline, Afrin x5  days.  May also use Mucinex DM for cough and congestion.  Patient may  also use Tylenol.  Patient is goingout of town on vacation.  He was  given a Z-Pak to have on hold in case symptoms worsen with purulent  sputum.  Patient will return back to our office as scheduled or sooner  if needed.      Rubye Oaks, NP  Electronically Signed      Terry Cummings. Terry Sires, MD, [facility]  Electronically Signed   TP/MedQ  DD: 05/01/2007  DT: 05/02/2007  Job #: [id number]

## 2011-02-22 NOTE — Assessment & Plan Note (Signed)
[facility]                             PULMONARY OFFICE NOTE   Terry Cummings, Terry Cummings                       MRN:          [id number]  DATE:01/02/2007                            DOB:          1961/05/19    PRIMARY SERVICE COMPREHENSIVE HEALTHCARE EVALUATION:   HISTORY:  A 50 year old, white male with a lifelong struggle with morbid  obesity with an ideal weight of 171 but not being able to make any  significant progression toward goal and complicated by hyperlipidemia.  He says he doesn't know why he is gaining weight, but admits that he  is not particularly good at watching either calories in or calories out  as previously recommended in discussions with me here.  He, however,  denies any excessive hypersomnolence, snoring, exertional chest pain,  claudication symptoms, orthopnea, PND, or leg swelling.  He denies any  heat or cold intolerance.   PAST MEDICAL HISTORY:  1. Hematuria with a history of nephrolithiasis in 2002, previously      followed by Dr. Isabel Caprice.  2. Chronic back pain and knee pain followed by Dr. Simonne Come.  3. History of rectal bleeding followed by Dr. Randa Evens with most recent      colonoscopy dated 2007, significant for diverticulosis and      hemorrhoids only.   ALLERGIES:  CODEINE CAUSES NAUSEA.   MEDICATIONS:  1. Glucosamine two daily.  2. Prilosec one daily.   SOCIAL HISTORY:  He works as a Psychologist, occupational.  He has never smoked.  He does  chew tobacco.  He gets no regular aerobic exercise.   FAMILY HISTORY:  Positive for diabetes in mother and father.  Negative  for colon cancer.  Positive for alcoholism and cirrhosis in his father.  Two brothers and sisters are all healthy with no weight issues.  The  only cancer in his family is brain cancer in his father.   REVIEW OF SYSTEMS:  Taken in detail on the worksheet.  Negative, except  as outlined above.  He does have occasional nocturia but denies any  decreased force or  stream.   PHYSICAL EXAMINATION:  GENERAL:  This is a massively obese, pleasant,  ambulatory, white male weighing 259 pounds, which was up about 16 pounds  from previous visit.  HEENT:  Oropharynx was clear.  Dentition was intact.  Nasal turbinates  normal.  NECK:  Supple without cervical adenopathy or tenderness.  Trachea was  midline.  No thyromegaly.  LUNGS:  Lung fields are completely clear bilaterally to auscultation and  percussion.  CARDIAC:  Regular rate and rhythm without murmur, gallop, rub.  ABDOMEN:  Soft, benign, no masses or tenderness.  EXTREMITIES:  Warm without calf tenderness, cyanosis, clubbing or edema.  Pedal pulses were present bilaterally and symmetric.  NEUROLOGIC:  No focal deficits, pathologic reflexes.  SKIN:  Warm and dry with no lesions.  MUSCULOSKELETAL:  Exam completely within normal limits.   LABORATORY DATA:  EKG was normal.  CBC was normal.  BMET revealed a  fasting blood sugar of 113.  Cholesterol was 230 with an LDL of 172, HDL  of 36.  TSH was normal.  CRP was only 2.0.   IMPRESSION:  1. Morbid obesity secondary to poor appreciation of caloric balance.      I spent extra time going over a calorie balance sheet with him      using the analogy of a bank account to help him understand that he      must burn more calories up and take less calories in, and that if      he wants to go to a nutritionist I would be happy to refer him with      his wife (who also has problems with weight control) but we will      see how he does on his own for the next three months at his      request.  Normal TSH.  2. Hyperlipidemia secondary to weight issues.  Note:  He has negative      family history but is male gender. A reasonable target would be      less than 160 LDL, which he could easily achieve with diet and      exercise.  Note that the CRP is relatively normal.  3. His degenerative arthritis is directly related to excess weight.  I      discussed the impact  of his weight on his joints over time if he is      not able to turn this problem around.  4. General health maintenance.  He was due a tetanus today, which was      given.  Follow up will be in three months to see how he is doing      with diet and exercise.     Charlaine Dalton. Sherene Sires, MD, [facility]  Electronically Signed    MBW/MedQ  DD: 01/05/2007  DT: 01/06/2007  Job #: [id number]

## 2011-02-22 NOTE — Op Note (Signed)
NAME:  Terry Cummings, Terry Cummings                          ACCOUNT NO.:  [id number]   MEDICAL RECORD NO.:  [id number]                   PATIENT TYPE:  AMB   LOCATION:  DAY                                  FACILITY:  [facility]   PHYSICIAN:  Marlowe Kays, M.D.               DATE OF BIRTH:  [birth year]   DATE OF PROCEDURE:  12/07/2002  DATE OF DISCHARGE:                                 OPERATIVE REPORT   PREOPERATIVE DIAGNOSES:  Medial shelf plica, right knee.   POSTOPERATIVE DIAGNOSES:  1. Medial shelf  plica, right knee.  2. 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  3. Grade 2 chondromalacia, medial femoral condyle, right knee.   OPERATION:  1. Right knee arthroscopy with one excision of medial shelf plica.  2. Debridement of medial facet of patella and medial femoral condyle.   SURGEON:  Marlowe Kays, M.D.   ASSISTANT:  __________   ANESTHESIA:  General.   INDICATIONS FOR PROCEDURE:  Symptoms all date from a twisting-type injury  sustained on March 27, 2003.  He has had persistent medial pain, mixed with  some lateral point pain.  An MRI of the knee demonstrated a medial shelf  plica, but no other significant abnormalities.  Because of the persistent  symptoms, he is here today for evaluation.  See the operative description  below for additional details and pathology.   DESCRIPTION OF PROCEDURE:  Under satisfactory general anesthesia, the  pneumatic tourniquet is placed and stabilizing the right knee.  It was  prepped with Duraprep and draped in a sterile field.  Ace wrap on left leg.  A superomedial saline inflow; first through an anterolateral portal in the  medial compartment and the joint is evaluated.  He had a large focus of  synovium, which was overlying the anteromedial meniscus, consisting of  injury, which I pictured and resected with a 3.5 shaver.  The remainder of  the medial compartment of his knee joint looked normal, with a normal-  looking medial  meniscus and medial femoral condyle.  Looking up the medial  gutter and suprapatellar area, he had a focus of grade 2/4 chondromalacia of  the medial area of the medial femoral condyle, consistent with his point of  injury and symptoms, and looking at the patellofemoral interval, he had a  large amount of reactive tissue and chondromalacia of the entire medial  facet of the patella.  This was associated with a large, firm plica.  With  flexion and extension of the knee, I could not definitely say that the plica  was getting entrapped, but the lesion on the medial femoral condyle and  lesion of the patella were adjacent to one another consistent with an injury  to both.  I then used the 3.5 shaver to smooth down the patella and the  medial femoral condyle.  I then used scissors to cut the plica and use  the  shaver to resect the remnants.  Then with reverse pulls, ACL, lateral joint,  lateral meniscus were all normal; in contrast, to medial joint that did not  have that focus of synovitis.   I then looked up the lateral gutter and suprapatellar area, and no  abnormalities were noted.  I checked to be sure that the plica had been  excised from this perspective.  The knee joint was then irrigated until  clear and arthroscope removed.  The two anterior portals were closed with 4-  0 nylon; 20 mL of 0.5% Marcaine with adrenaline and 4 mg  of morphine were then instilled into the arthroscopy __________ .  The knee  wrapped in Adaptic and a dry sterile dressing applied.  The tourniquet was  released.  He tolerated the procedure well and was taken to the recovery  room in satisfactory condition.  There were no known complications.                                               Marlowe Kays, M.D.    JA/MEDQ  D:  12/07/2002  T:  12/07/2002  Job:  [id number]

## 2011-02-22 NOTE — Procedures (Signed)
[facility]  Patient:    Terry Cummings, Terry Cummings                       MRN: [id number] Proc. Date: 02/27/01 Adm. Date:  [id number] Attending:  Orland Mustard CC:         Charlaine Dalton. Sherene Sires, M.D. [facility]   Procedure Report  PROCEDURE:  Colonoscopy.  MEDICATIONS:  Fentanyl 125 mcg, Versed 10 mg IV.  INDICATIONS:  The patient has had a history of an adenomatous polyps and has not undergone any follow-up.  This is done as a follow-up procedure.  DESCRIPTION OF PROCEDURE:  The procedure had been explained to the patient and consent obtained.  With the patient in the left lateral decubitus position, the adult Olympus video colonoscope was inserted and advanced under direct visualization.  The prep was excellent.  We were able to advance to the cecum. The ileocecal valve and appendiceal orifice were seen.  The scope was withdrawn.  The cecum, ascending colon, hepatic flexure, transverse colon, splenic flexure, descending, and sigmoid colon were seen.  No polyps or other lesions were seen.  The scope was withdrawn, and the patient tolerated the procedure well.  ASSESSMENT:  No evidence of further colon polyps.  PLAN:  We will recommend repeating in five years. DD:  02/27/01 TD:  02/27/01 Job: [id number]

## 2012-02-13 ENCOUNTER — Encounter: Payer: Self-pay | Admitting: Pulmonary Disease

## 2012-02-14 ENCOUNTER — Other Ambulatory Visit (INDEPENDENT_AMBULATORY_CARE_PROVIDER_SITE_OTHER): Payer: 59

## 2012-02-14 ENCOUNTER — Ambulatory Visit (INDEPENDENT_AMBULATORY_CARE_PROVIDER_SITE_OTHER)
Admission: RE | Admit: 2012-02-14 | Discharge: 2012-02-14 | Disposition: A | Discharge status: Home / Self Care | Payer: 59 | Source: Ambulatory Visit | Attending: Internal Medicine | Admitting: Internal Medicine

## 2012-02-14 ENCOUNTER — Ambulatory Visit (INDEPENDENT_AMBULATORY_CARE_PROVIDER_SITE_OTHER): Payer: 59 | Admitting: Internal Medicine

## 2012-02-14 ENCOUNTER — Encounter: Payer: Self-pay | Admitting: Internal Medicine

## 2012-02-14 DIAGNOSIS — Z0000 Encounter for general adult medical examination without abnormal findings: Secondary | ICD-10-CM

## 2012-02-14 DIAGNOSIS — E785 Hyperlipidemia, unspecified: Secondary | ICD-10-CM

## 2012-02-14 DIAGNOSIS — E669 Obesity, unspecified: Secondary | ICD-10-CM

## 2012-02-14 DIAGNOSIS — K219 Gastro-esophageal reflux disease without esophagitis: Secondary | ICD-10-CM

## 2012-02-14 LAB — CBC WITH DIFFERENTIAL/PLATELET
Basophils Absolute: 0 10*3/uL (ref 0.0–0.1)
HCT: 42.5 % (ref 39.0–52.0)
Lymphs Abs: 2.1 10*3/uL (ref 0.7–4.0)
Monocytes Relative: 8.7 % (ref 3.0–12.0)
Neutrophils Relative %: 59.6 % (ref 43.0–77.0)
Platelets: 265 10*3/uL (ref 150.0–400.0)
RDW: 13.6 % (ref 11.5–14.6)
WBC: 7.5 10*3/uL (ref 4.5–10.5)

## 2012-02-14 LAB — URINALYSIS
Ketones, ur: NEGATIVE
Leukocytes, UA: NEGATIVE
Nitrite: NEGATIVE
Specific Gravity, Urine: 1.025 (ref 1.000–1.030)
Total Protein, Urine: NEGATIVE
pH: 6 (ref 5.0–8.0)

## 2012-02-14 LAB — HEPATIC FUNCTION PANEL
AST: 21 U/L (ref 0–37)
Bilirubin, Direct: 0.1 mg/dL (ref 0.0–0.3)
Total Bilirubin: 0.5 mg/dL (ref 0.3–1.2)

## 2012-02-14 LAB — BASIC METABOLIC PANEL
BUN: 18 mg/dL (ref 6–23)
Calcium: 9.4 mg/dL (ref 8.4–10.5)
Creatinine, Ser: 0.9 mg/dL (ref 0.4–1.5)
GFR: 90.32 mL/min (ref >60.00)
Glucose, Bld: 108 mg/dL — ABNORMAL HIGH (ref 70–99)
Potassium: 4.5 mEq/L (ref 3.5–5.1)

## 2012-02-14 LAB — LIPID PANEL
HDL: 37.2 mg/dL — ABNORMAL LOW (ref >39.00)
Triglycerides: 122 mg/dL (ref 0.0–149.0)

## 2012-02-14 LAB — TSH: TSH: 1.53 u[IU]/mL (ref 0.35–5.50)

## 2012-02-14 NOTE — Assessment & Plan Note (Signed)
Adequate control on present rx, reviewed  

## 2012-02-14 NOTE — Assessment & Plan Note (Addendum)
-   Target wt = 190 for BMI < 30   Calorie balance reviewed

## 2012-02-14 NOTE — Progress Notes (Deleted)
dfads

## 2012-02-14 NOTE — Progress Notes (Signed)
  Subjective:    Patient ID: Terry Cummings, male    DOB: [birth year]   MRN: [id number]  HPI  12 yowm never smoker with morbid obesity complicated by hyperlipidemia and GERD  02/14/2012 ov/[name] CPX no luck with wt loss, has seen nutritionist with wife, not compliant with diet or exercise but considering getting a treadmill.  No sob, tia,claudication, hypersomnolence        Allergies   1) ! Codeine    Past Medical History:  KNEE PAIN, CHRONIC (ICD-719.46)  BACK PAIN, CHRONIC (ICD-724.5)  NEPHROLITHIASIS, HX OF (ICD-V13.01)  OBESITY (ICD-278.00)  - Target wt = 190 for BMI < 30  HYPERLIPIDEMIA (ICD-272.4)  - Target < 130 LDL with only risk factor male gender/Pos fm hx brother IHD  Diverticulosis...........................................Marland KitchenEdwards  - Colonoscopy 04/04/2006  Health Maintenance.................................Marland KitchenWert  - Td 12/2006  - CPX 02/14/2012    Family History:  Alcoholism/Cirrhosis- Father  DM- Mother and Father  Brain CA- Father  IHD Older brother   Social History:  Psychologist, occupational  Never smoker  No ETOH     Review of Systems  Constitutional: Negative for fever, chills, diaphoresis, activity change, appetite change, fatigue and unexpected weight change.  HENT: Positive for tinnitus. Negative for hearing loss, ear pain, nosebleeds, congestion, sore throat, facial swelling, rhinorrhea, sneezing, mouth sores, trouble swallowing, neck pain, neck stiffness, dental problem, voice change, postnasal drip, sinus pressure and ear discharge.   Eyes: Negative for photophobia, discharge, itching and visual disturbance.  Respiratory: Negative for apnea, cough, choking, chest tightness, shortness of breath, wheezing and stridor.   Cardiovascular: Negative for chest pain, palpitations and leg swelling.  Gastrointestinal: Negative for nausea, vomiting, abdominal pain, constipation, blood in stool and abdominal distention.  Genitourinary: Positive for frequency. Negative for  dysuria, urgency, hematuria, flank pain, decreased urine volume and difficulty urinating.  Musculoskeletal: Negative for myalgias, back pain, joint swelling, arthralgias and gait problem.  Skin: Negative for color change, pallor and rash.  Neurological: Negative for dizziness, tremors, seizures, syncope, speech difficulty, weakness, light-headedness, numbness and headaches.  Hematological: Negative for adenopathy. Does not bruise/bleed easily.  Psychiatric/Behavioral: Negative for confusion, sleep disturbance and agitation. The patient is not nervous/anxious.        Objective:   Physical Exam  Wt Readings from Last 3 Encounters:  02/14/12 270 lb (122.471 kg)  10/11/10 259 lb (117.482 kg)  04/06/10 258 lb (117.028 kg)       obese amb wm nad  HEENT: upper dentures and lower partials, nl turbinates, and orophanx. Nl external ear canals without cough reflex  NECK : without JVD/Nodes/TM/ nl carotid upstrokes bilaterally  LUNGS: no acc muscle use, clear to A and P bilaterally without cough on insp or exp maneuvers  CV: RRR no s3 or murmur or increase in P2, no edema  ABD: soft and nontender with nl excursion in the supine position. No bruits or organomegaly, bowel sounds nl  MS: warm without deformities, calf tenderness, cyanosis or clubbing  SKIN: warm and dry without lesions  NEURO: alert, approp, no deficits  GU uncirm, testes bilaterally descended  Rectal: mild bph, smooth guiac neg  CXR  02/14/2012 :   Borderline cardiac size. No pulmonary edema, pneumonia, or pleural effusion. Slight aortic ectasia. Osteophyte formation in the spine.      Assessment & Plan:

## 2012-02-14 NOTE — Assessment & Plan Note (Signed)
-   Target LDL < 130 with Pos fm hx/ male gender  Not Adequate control on present rx, reviewed  Options, wants to work on diet and ex first, continue a baby asa daily

## 2012-02-14 NOTE — Patient Instructions (Addendum)
Weight control is simply a matter of calorie balance which needs to be tilted in your favor by eating less and exercising more.  To get the most out of exercise, you need to be continuously aware that you are short of breath, but never out of breath, for 30 minutes daily. As you improve, it will actually be easier for you to do the same amount of exercise  in  30 minutes so always push to the level where you are short of breath.  If this does not result in gradual weight reduction then I strongly recommend you see a nutritionist with a food diary x 2 weeks so that we can work out a negative calorie balance which is universally effective in steady weight loss programs.  Think of your calorie balance like you do your bank account where in this case you want the balance to go down so you must take in less calories than you burn up.  It's just that simple:  Hard to do, but easy to understand.  Good luck!   Please remember to go to the lab and x-ray department downstairs for your tests - we will call you with the results when they are available.  Late Add Note LDL > 160 > rec recheck 3 months on diet/ex

## 2012-02-17 ENCOUNTER — Telehealth: Payer: Self-pay | Admitting: Internal Medicine

## 2012-02-17 NOTE — Telephone Encounter (Signed)
I spoke with patient about results and he verbalized understanding and had no questions 

## 2016-01-05 ENCOUNTER — Ambulatory Visit: Payer: Self-pay | Admitting: Internal Medicine

## 2016-01-26 ENCOUNTER — Encounter: Payer: Self-pay | Admitting: Internal Medicine

## 2016-01-26 ENCOUNTER — Other Ambulatory Visit (INDEPENDENT_AMBULATORY_CARE_PROVIDER_SITE_OTHER): Payer: 59

## 2016-01-26 ENCOUNTER — Ambulatory Visit (INDEPENDENT_AMBULATORY_CARE_PROVIDER_SITE_OTHER): Payer: 59 | Admitting: Internal Medicine

## 2016-01-26 VITALS — BP 126/68 | HR 58 | Temp 98.0°F | Resp 18 | Ht 68.0 in | Wt 252.0 lb

## 2016-01-26 DIAGNOSIS — E785 Hyperlipidemia, unspecified: Secondary | ICD-10-CM

## 2016-01-26 DIAGNOSIS — Z1159 Encounter for screening for other viral diseases: Secondary | ICD-10-CM

## 2016-01-26 DIAGNOSIS — J309 Allergic rhinitis, unspecified: Secondary | ICD-10-CM | POA: Insufficient documentation

## 2016-01-26 DIAGNOSIS — Z0000 Encounter for general adult medical examination without abnormal findings: Secondary | ICD-10-CM

## 2016-01-26 DIAGNOSIS — E669 Obesity, unspecified: Secondary | ICD-10-CM | POA: Diagnosis not present

## 2016-01-26 DIAGNOSIS — I10 Essential (primary) hypertension: Secondary | ICD-10-CM | POA: Insufficient documentation

## 2016-01-26 DIAGNOSIS — J301 Allergic rhinitis due to pollen: Secondary | ICD-10-CM

## 2016-01-26 LAB — COMPREHENSIVE METABOLIC PANEL
ALT: 25 U/L (ref 0–53)
AST: 21 U/L (ref 0–37)
Albumin: 4.2 g/dL (ref 3.5–5.2)
Alkaline Phosphatase: 44 U/L (ref 39–117)
BUN: 23 mg/dL (ref 6–23)
CO2: 29 meq/L (ref 19–32)
Calcium: 9.8 mg/dL (ref 8.4–10.5)
Chloride: 104 mEq/L (ref 96–112)
Creatinine, Ser: 1.59 mg/dL — ABNORMAL HIGH (ref 0.40–1.50)
GFR: 47.94 mL/min — AB (ref >60.00)
GLUCOSE: 115 mg/dL — AB (ref 70–99)
POTASSIUM: 4.4 meq/L (ref 3.5–5.1)
SODIUM: 140 meq/L (ref 135–145)
Total Bilirubin: 0.4 mg/dL (ref 0.2–1.2)
Total Protein: 7.6 g/dL (ref 6.0–8.3)

## 2016-01-26 LAB — HEPATITIS C ANTIBODY: HCV AB: NEGATIVE

## 2016-01-26 LAB — LIPID PANEL
CHOLESTEROL: 190 mg/dL (ref 0–200)
HDL: 34.2 mg/dL — ABNORMAL LOW (ref >39.00)
LDL Cholesterol: 128 mg/dL — ABNORMAL HIGH (ref 0–99)
NONHDL: 155.4
Total CHOL/HDL Ratio: 6
Triglycerides: 138 mg/dL (ref 0.0–149.0)
VLDL: 27.6 mg/dL (ref 0.0–40.0)

## 2016-01-26 LAB — HIV ANTIBODY (ROUTINE TESTING W REFLEX): HIV 1&2 Ab, 4th Generation: NONREACTIVE

## 2016-01-26 MED ORDER — CETIRIZINE HCL 10 MG PO TABS: 10.0000 mg | ORAL_TABLET | Freq: Every day | ORAL | Status: DC

## 2016-01-26 NOTE — Progress Notes (Signed)
Pre visit review using our clinic review tool, if applicable. No additional management support is needed unless otherwise documented below in the visit note. 

## 2016-01-26 NOTE — Assessment & Plan Note (Signed)
Checking lipid panel and adjust as needed. On pravastatin 20 mg daily without side effects.

## 2016-01-26 NOTE — Patient Instructions (Addendum)
We have recommended that you try zyrtec (also called cetirizine) 10 mg for the allergies that you can take while the pollen is out. It does take about 2-3 days to work fully. Do not get zyrtec-d as the d part can raise your blood pressure.  We are checking the blood work today and will call you back with the results.   Health Maintenance, Male A healthy lifestyle and preventative care can promote health and wellness.  Maintain regular health, dental, and eye exams.  Eat a healthy diet. Foods like vegetables, fruits, whole grains, low-fat dairy products, and lean protein foods contain the nutrients you need and are low in calories. Decrease your intake of foods high in solid fats, added sugars, and salt. Get information about a proper diet from your health care provider, if necessary.  Regular physical exercise is one of the most important things you can do for your health. Most adults should get at least 150 minutes of moderate-intensity exercise (any activity that increases your heart rate and causes you to sweat) each week. In addition, most adults need muscle-strengthening exercises on 2 or more days a week.   Maintain a healthy weight. The body mass index (BMI) is a screening tool to identify possible weight problems. It provides an estimate of body fat based on height and weight. Your health care provider can find your BMI and can help you achieve or maintain a healthy weight. For males 20 years and older:  A BMI below 18.5 is considered underweight.  A BMI of 18.5 to 24.9 is normal.  A BMI of 25 to 29.9 is considered overweight.  A BMI of 30 and above is considered obese.  Maintain normal blood lipids and cholesterol by exercising and minimizing your intake of saturated fat. Eat a balanced diet with plenty of fruits and vegetables. Blood tests for lipids and cholesterol should begin at age 19 and be repeated every 5 years. If your lipid or cholesterol levels are high, you are over age  37, or you are at high risk for heart disease, you may need your cholesterol levels checked more frequently.Ongoing high lipid and cholesterol levels should be treated with medicines if diet and exercise are not working.  If you smoke, find out from your health care provider how to quit. If you do not use tobacco, do not start.  Lung cancer screening is recommended for adults aged 87-80 years who are at high risk for developing lung cancer because of a history of smoking. A yearly low-dose CT scan of the lungs is recommended for people who have at least a 30-pack-year history of smoking and are current smokers or have quit within the past 15 years. A pack year of smoking is smoking an average of 1 pack of cigarettes a day for 1 year (for example, a 30-pack-year history of smoking could mean smoking 1 pack a day for 30 years or 2 packs a day for 15 years). Yearly screening should continue until the smoker has stopped smoking for at least 15 years. Yearly screening should be stopped for people who develop a health problem that would prevent them from having lung cancer treatment.  If you choose to drink alcohol, do not have more than 2 drinks per day. One drink is considered to be 12 oz (360 mL) of beer, 5 oz (150 mL) of wine, or 1.5 oz (45 mL) of liquor.  Avoid the use of street drugs. Do not share needles with anyone. Ask for help  if you need support or instructions about stopping the use of drugs.  High blood pressure causes heart disease and increases the risk of stroke. High blood pressure is more likely to develop in:  People who have blood pressure in the end of the normal range (100-139/85-89 mm Hg).  People who are overweight or obese.  People who are African American.  If you are 28-89 years of age, have your blood pressure checked every 3-5 years. If you are 60 years of age or older, have your blood pressure checked every year. You should have your blood pressure measured twice--once when  you are at a hospital or clinic, and once when you are not at a hospital or clinic. Record the average of the two measurements. To check your blood pressure when you are not at a hospital or clinic, you can use:  An automated blood pressure machine at a pharmacy.  A home blood pressure monitor.  If you are 55-69 years old, ask your health care provider if you should take aspirin to prevent heart disease.  Diabetes screening involves taking a blood sample to check your fasting blood sugar level. This should be done once every 3 years after age 73 if you are at a normal weight and without risk factors for diabetes. Testing should be considered at a younger age or be carried out more frequently if you are overweight and have at least 1 risk factor for diabetes.  Colorectal cancer can be detected and often prevented. Most routine colorectal cancer screening begins at the age of 26 and continues through age 20. However, your health care provider may recommend screening at an earlier age if you have risk factors for colon cancer. On a yearly basis, your health care provider may provide home test kits to check for hidden blood in the stool. A small camera at the end of a tube may be used to directly examine the colon (sigmoidoscopy or colonoscopy) to detect the earliest forms of colorectal cancer. Talk to your health care provider about this at age 45 when routine screening begins. A direct exam of the colon should be repeated every 5-10 years through age 17, unless early forms of precancerous polyps or small growths are found.  People who are at an increased risk for hepatitis B should be screened for this virus. You are considered at high risk for hepatitis B if:  You were born in a country where hepatitis B occurs often. Talk with your health care provider about which countries are considered high risk.  Your parents were born in a high-risk country and you have not received a shot to protect against  hepatitis B (hepatitis B vaccine).  You have HIV or AIDS.  You use needles to inject street drugs.  You live with, or have sex with, someone who has hepatitis B.  You are a man who has sex with other men (MSM).  You get hemodialysis treatment.  You take certain medicines for conditions like cancer, organ transplantation, and autoimmune conditions.  Hepatitis C blood testing is recommended for all people born from [birth year] through 1967 and any individual with known risk factors for hepatitis C.  Healthy men should no longer receive prostate-specific antigen (PSA) blood tests as part of routine cancer screening. Talk to your health care provider about prostate cancer screening.  Testicular cancer screening is not recommended for adolescents or adult males who have no symptoms. Screening includes self-exam, a health care provider exam, and other screening tests.  Consult with your health care provider about any symptoms you have or any concerns you have about testicular cancer.  Practice safe sex. Use condoms and avoid high-risk sexual practices to reduce the spread of sexually transmitted infections (STIs).  You should be screened for STIs, including gonorrhea and chlamydia if:  You are sexually active and are younger than 24 years.  You are older than 24 years, and your health care provider tells you that you are at risk for this type of infection.  Your sexual activity has changed since you were last screened, and you are at an increased risk for chlamydia or gonorrhea. Ask your health care provider if you are at risk.  If you are at risk of being infected with HIV, it is recommended that you take a prescription medicine daily to prevent HIV infection. This is called pre-exposure prophylaxis (PrEP). You are considered at risk if:  You are a man who has sex with other men (MSM).  You are a heterosexual man who is sexually active with multiple partners.  You take drugs by  injection.  You are sexually active with a partner who has HIV.  Talk with your health care provider about whether you are at high risk of being infected with HIV. If you choose to begin PrEP, you should first be tested for HIV. You should then be tested every 3 months for as long as you are taking PrEP.  Use sunscreen. Apply sunscreen liberally and repeatedly throughout the day. You should seek shade when your shadow is shorter than you. Protect yourself by wearing long sleeves, pants, a wide-brimmed hat, and sunglasses year round whenever you are outdoors.  Tell your health care provider of new moles or changes in moles, especially if there is a change in shape or color. Also, tell your health care provider if a mole is larger than the size of a pencil eraser.  A one-time screening for abdominal aortic aneurysm (AAA) and surgical repair of large AAAs by ultrasound is recommended for men aged 41-75 years who are current or former smokers.  Stay current with your vaccines (immunizations).   This information is not intended to replace advice given to you by your health care provider. Make sure you discuss any questions you have with your health care provider.   Document Released: 03/21/2008 Document Revised: 10/14/2014 Document Reviewed: 02/18/2011 Elsevier Interactive Patient Education [employer].

## 2016-01-26 NOTE — Progress Notes (Signed)
   Subjective:    Patient ID: Terry Cummings, male    DOB: [birth year], 55 y.o.   MRN: [id number]  HPI The patient is a new 55 YO man coming in for allergies. He has been having congestion, sore throat, headaches for the last 1 week. He has not tried anything due to having high blood pressure he was not sure what he could take. Overall stable and not worsening. No fevers or chills. No SOB or chest pains. No abdominal pain or other symptoms. No muscle aches.   PMH, [facility], social history reviewed and updated.   Review of Systems  Constitutional: Negative for fever, activity change, appetite change, fatigue and unexpected weight change.  HENT: Positive for congestion, postnasal drip and sore throat. Negative for ear discharge, ear pain, rhinorrhea, sinus pressure and trouble swallowing.   Eyes: Negative.   Respiratory: Negative for cough, chest tightness, shortness of breath and wheezing.   Cardiovascular: Negative for chest pain, palpitations and leg swelling.  Gastrointestinal: Negative for nausea, abdominal pain, diarrhea, constipation and abdominal distention.  Musculoskeletal: Negative for myalgias, back pain, arthralgias and neck pain.  Skin: Negative.   Neurological: Negative.   Psychiatric/Behavioral: Negative.       Objective:   Physical Exam  Constitutional: He is oriented to person, place, and time. He appears well-developed and well-nourished.  HENT:  Head: Normocephalic and atraumatic.  Oropharynx red with mild clear drainage, no crusting nasal turbinates, ear TM bilateral normal.   Eyes: EOM are normal.  Neck: Normal range of motion.  Cardiovascular: Normal rate and regular rhythm.   Pulmonary/Chest: Effort normal and breath sounds normal. No respiratory distress. He has no wheezes. He has no rales.  Abdominal: Soft. Bowel sounds are normal. He exhibits no distension. There is no tenderness. There is no rebound.  Musculoskeletal: He exhibits no edema.  Neurological: He is  alert and oriented to person, place, and time. Coordination normal.  Skin: Skin is warm and dry.  Psychiatric: He has a normal mood and affect.   Filed Vitals:   01/26/16 0902  BP: 126/68  Pulse: 58  Temp: 98 F (36.7 C)  TempSrc: Oral  Resp: 18  Height: 5\' 8"  (1.727 m)  Weight: 252 lb (114.306 kg)  SpO2: 98%      Assessment & Plan:

## 2016-01-26 NOTE — Assessment & Plan Note (Signed)
At goal on lisinopril/hctz. Reviewed recent CMP from [facility] and no indication for change. BP at goal and will continue.

## 2016-01-26 NOTE — Assessment & Plan Note (Signed)
Advised to use otc zyrtec and avoid allergens if possible. Wear mask while doing yard work. Advised to not take cold medicine or the d part of allergy medicine as they could elevate his blood pressure.

## 2016-01-26 NOTE — Assessment & Plan Note (Signed)
We talked about his BMI and he is not exercising right now and diet is mediocre. He does not wish any more intervention right now.

## 2016-02-01 ENCOUNTER — Telehealth: Payer: Self-pay

## 2016-02-02 NOTE — Telephone Encounter (Signed)
Amy he wants you to call him back to go over his labs. And he also is very worried about if his medications are going to be refilled in month when he runs out. Thank you. Please followup.

## 2016-02-02 NOTE — Telephone Encounter (Signed)
Patient aware of lab results.

## 2016-04-29 ENCOUNTER — Other Ambulatory Visit: Payer: Self-pay | Admitting: Internal Medicine

## 2016-07-24 ENCOUNTER — Ambulatory Visit (INDEPENDENT_AMBULATORY_CARE_PROVIDER_SITE_OTHER): Payer: 59 | Admitting: Internal Medicine

## 2016-07-24 ENCOUNTER — Encounter: Payer: Self-pay | Admitting: Internal Medicine

## 2016-07-24 ENCOUNTER — Other Ambulatory Visit (INDEPENDENT_AMBULATORY_CARE_PROVIDER_SITE_OTHER): Payer: 59

## 2016-07-24 VITALS — BP 110/76 | HR 64 | Temp 98.1°F | Resp 16 | Ht 68.0 in | Wt 260.0 lb

## 2016-07-24 DIAGNOSIS — I10 Essential (primary) hypertension: Secondary | ICD-10-CM | POA: Diagnosis not present

## 2016-07-24 DIAGNOSIS — M546 Pain in thoracic spine: Secondary | ICD-10-CM

## 2016-07-24 LAB — BASIC METABOLIC PANEL
BUN: 23 mg/dL (ref 6–23)
CHLORIDE: 103 meq/L (ref 96–112)
CO2: 28 meq/L (ref 19–32)
Calcium: 9.8 mg/dL (ref 8.4–10.5)
Creatinine, Ser: 1.56 mg/dL — ABNORMAL HIGH (ref 0.40–1.50)
GFR: 48.92 mL/min — ABNORMAL LOW (ref >60.00)
GLUCOSE: 95 mg/dL (ref 70–99)
POTASSIUM: 4.3 meq/L (ref 3.5–5.1)
Sodium: 137 mEq/L (ref 135–145)

## 2016-07-24 MED ORDER — CYCLOBENZAPRINE HCL 5 MG PO TABS: 5.0000 mg | ORAL_TABLET | Freq: Three times a day (TID) | ORAL | 0 refills | Status: DC | PRN

## 2016-07-24 NOTE — Patient Instructions (Signed)
We have sent in flexeril for the pain which you can take 1 pill three times a day as needed for pain.  This is likely from the muscles but we are checking the labs for the kidneys today as well.

## 2016-07-24 NOTE — Assessment & Plan Note (Signed)
Rx for flexeril today for the pain. Encouraged to still use tylenol if needed for pain. No red flags so no imaging indicated.

## 2016-07-24 NOTE — Progress Notes (Signed)
   Subjective:    Patient ID: Terry Cummings, male    DOB: 27-May-1961, 55 y.o.   MRN: [id number]  HPI The patient is a 55 YO man coming in for acute back pain. Started about 3 days ago. Came on during the day and gradually worsened. He has tried aleve for it without success. Pain is 5/10 and when worse 9/10. Gets stabs of pain maybe 1-2 times per day that is severe but mostly moderate pain. He has been able to do his usual activities. No overuse or injury recently that he recalls. No pain with inspiration and no abdominal pain, nausea, vomiting, diarrhea, constipation. He has had kidney stones in the past but this does not feel like that. No radiation of the pain to arm or legs and no numbness or weakness in the arms or legs.   Review of Systems  Constitutional: Positive for activity change. Negative for appetite change, chills, fatigue, fever and unexpected weight change.  Respiratory: Negative.   Cardiovascular: Negative.   Gastrointestinal: Negative.   Musculoskeletal: Positive for back pain and myalgias. Negative for arthralgias, gait problem, joint swelling, neck pain and neck stiffness.  Skin: Negative.   Neurological: Negative.       Objective:   Physical Exam  Constitutional: He is oriented to person, place, and time. He appears well-developed and well-nourished.  HENT:  Head: Normocephalic and atraumatic.  Eyes: EOM are normal.  Neck: Normal range of motion.  Cardiovascular: Normal rate and regular rhythm.   Pulmonary/Chest: Effort normal.  Abdominal: Soft. Bowel sounds are normal. He exhibits no distension. There is no tenderness. There is no rebound.  Musculoskeletal: He exhibits tenderness.  Pain in band across the thoracic spine without radiation. Moderate in intensity.   Neurological: He is alert and oriented to person, place, and time.  Skin: Skin is warm and dry.   Vitals:   07/24/16 1041  BP: 110/76  Pulse: 64  Resp: 16  Temp: 98.1 F (36.7 C)  TempSrc: Oral    SpO2: 98%  Weight: 260 lb (117.9 kg)  Height: 5\' 8"  (1.727 m)      Assessment & Plan:

## 2016-07-24 NOTE — Assessment & Plan Note (Signed)
Recheck BMP today as last creatinine was above prior baseline.

## 2016-07-24 NOTE — Progress Notes (Signed)
Pre visit review using our clinic review tool, if applicable. No additional management support is needed unless otherwise documented below in the visit note. 

## 2016-07-30 ENCOUNTER — Telehealth: Payer: Self-pay | Admitting: Internal Medicine

## 2016-07-30 NOTE — Telephone Encounter (Signed)
Pt request lab work result that was done 07/24/16. Please call him back

## 2016-07-31 NOTE — Telephone Encounter (Signed)
Advised patient that we will call him with his results as soon as they have been reviewed.

## 2016-08-09 ENCOUNTER — Other Ambulatory Visit: Payer: Self-pay | Admitting: Internal Medicine

## 2016-08-30 ENCOUNTER — Other Ambulatory Visit: Payer: Self-pay | Admitting: Internal Medicine

## 2016-09-23 ENCOUNTER — Other Ambulatory Visit (INDEPENDENT_AMBULATORY_CARE_PROVIDER_SITE_OTHER): Payer: 59

## 2016-09-23 ENCOUNTER — Encounter: Payer: Self-pay | Admitting: Internal Medicine

## 2016-09-23 ENCOUNTER — Ambulatory Visit (INDEPENDENT_AMBULATORY_CARE_PROVIDER_SITE_OTHER): Payer: 59 | Admitting: Internal Medicine

## 2016-09-23 VITALS — BP 132/72 | HR 78 | Temp 98.3°F | Resp 16 | Ht 68.0 in | Wt 254.0 lb

## 2016-09-23 DIAGNOSIS — Z0000 Encounter for general adult medical examination without abnormal findings: Secondary | ICD-10-CM

## 2016-09-23 DIAGNOSIS — R7989 Other specified abnormal findings of blood chemistry: Secondary | ICD-10-CM | POA: Diagnosis not present

## 2016-09-23 DIAGNOSIS — Z23 Encounter for immunization: Secondary | ICD-10-CM

## 2016-09-23 LAB — LDL CHOLESTEROL, DIRECT: Direct LDL: 148 mg/dL

## 2016-09-23 LAB — COMPREHENSIVE METABOLIC PANEL
ALT: 26 U/L (ref 0–53)
AST: 17 U/L (ref 0–37)
Albumin: 4.4 g/dL (ref 3.5–5.2)
Alkaline Phosphatase: 43 U/L (ref 39–117)
BILIRUBIN TOTAL: 0.4 mg/dL (ref 0.2–1.2)
BUN: 22 mg/dL (ref 6–23)
CO2: 27 meq/L (ref 19–32)
CREATININE: 1.73 mg/dL — AB (ref 0.40–1.50)
Calcium: 9.9 mg/dL (ref 8.4–10.5)
Chloride: 102 mEq/L (ref 96–112)
GFR: 43.39 mL/min — ABNORMAL LOW (ref >60.00)
GLUCOSE: 98 mg/dL (ref 70–99)
Potassium: 4.3 mEq/L (ref 3.5–5.1)
Sodium: 138 mEq/L (ref 135–145)
Total Protein: 7.5 g/dL (ref 6.0–8.3)

## 2016-09-23 LAB — CBC
HCT: 40.8 % (ref 39.0–52.0)
Hemoglobin: 13.6 g/dL (ref 13.0–17.0)
MCHC: 33.3 g/dL (ref 30.0–36.0)
MCV: 86.4 fl (ref 78.0–100.0)
PLATELETS: 265 10*3/uL (ref 150.0–400.0)
RBC: 4.72 Mil/uL (ref 4.22–5.81)
RDW: 13.1 % (ref 11.5–15.5)
WBC: 8 10*3/uL (ref 4.0–10.5)

## 2016-09-23 LAB — LIPID PANEL
Cholesterol: 220 mg/dL — ABNORMAL HIGH (ref 0–200)
HDL: 35 mg/dL — ABNORMAL LOW (ref >39.00)
NONHDL: 185.26
Total CHOL/HDL Ratio: 6
Triglycerides: 223 mg/dL — ABNORMAL HIGH (ref 0.0–149.0)
VLDL: 44.6 mg/dL — ABNORMAL HIGH (ref 0.0–40.0)

## 2016-09-23 MED ORDER — PRAVASTATIN SODIUM 20 MG PO TABS: 20.0000 mg | ORAL_TABLET | Freq: Every day | ORAL | 3 refills | Status: DC

## 2016-09-23 MED ORDER — LISINOPRIL-HYDROCHLOROTHIAZIDE 20-12.5 MG PO TABS: 1.0000 | ORAL_TABLET | Freq: Every day | ORAL | 3 refills | Status: DC

## 2016-09-23 NOTE — Assessment & Plan Note (Signed)
Colonoscopy up to date, tetanus shot up to date. Given flu shot at date. Counseled on sun safety and mole surveillance. Given screening recommendations. Counseled about dangers of distracted driving.

## 2016-09-23 NOTE — Progress Notes (Signed)
   Subjective:    Patient ID: HAN WENINGER, male    DOB: Jan 17, 1961, 55 y.o.   MRN: [id number]  HPI The patient is a 55 YO man coming in for wellness. No new concerns.  PMH, [facility], social history reviewed and updated.   Review of Systems  Constitutional: Negative.   HENT: Negative.   Eyes: Negative.   Respiratory: Negative for cough, chest tightness and shortness of breath.   Cardiovascular: Negative for chest pain, palpitations and leg swelling.  Gastrointestinal: Negative for abdominal distention, abdominal pain, constipation, diarrhea, nausea and vomiting.  Musculoskeletal: Negative.   Skin: Negative.   Neurological: Negative.   Psychiatric/Behavioral: Negative.       Objective:   Physical Exam  Constitutional: He is oriented to person, place, and time. He appears well-developed and well-nourished.  HENT:  Head: Normocephalic and atraumatic.  Eyes: EOM are normal.  Neck: Normal range of motion.  Cardiovascular: Normal rate and regular rhythm.   Pulmonary/Chest: Effort normal and breath sounds normal. No respiratory distress. He has no wheezes. He has no rales.  Abdominal: Soft. Bowel sounds are normal. He exhibits no distension. There is no tenderness. There is no rebound.  Musculoskeletal: He exhibits no edema.  Neurological: He is alert and oriented to person, place, and time. Coordination normal.  Skin: Skin is warm and dry.  Psychiatric: He has a normal mood and affect.   Vitals:   09/23/16 1422  BP: 132/72  Pulse: 78  Resp: 16  Temp: 98.3 F (36.8 C)  TempSrc: Oral  SpO2: 96%  Weight: 254 lb (115.2 kg)  Height: 5\' 8"  (1.727 m)      Assessment & Plan:  Flu shot given at visit.

## 2016-09-23 NOTE — Progress Notes (Signed)
Pre visit review using our clinic review tool, if applicable. No additional management support is needed unless otherwise documented below in the visit note. 

## 2016-09-23 NOTE — Patient Instructions (Signed)
We will check the labs and given you the flu shot.  Health Maintenance, Male A healthy lifestyle and preventative care can promote health and wellness.  Maintain regular health, dental, and eye exams.  Eat a healthy diet. Foods like vegetables, fruits, whole grains, low-fat dairy products, and lean protein foods contain the nutrients you need and are low in calories. Decrease your intake of foods high in solid fats, added sugars, and salt. Get information about a proper diet from your health care provider, if necessary.  Regular physical exercise is one of the most important things you can do for your health. Most adults should get at least 150 minutes of moderate-intensity exercise (any activity that increases your heart rate and causes you to sweat) each week. In addition, most adults need muscle-strengthening exercises on 2 or more days a week.   Maintain a healthy weight. The body mass index (BMI) is a screening tool to identify possible weight problems. It provides an estimate of body fat based on height and weight. Your health care provider can find your BMI and can help you achieve or maintain a healthy weight. For males 20 years and older:  A BMI below 18.5 is considered underweight.  A BMI of 18.5 to 24.9 is normal.  A BMI of 25 to 29.9 is considered overweight.  A BMI of 30 and above is considered obese.  Maintain normal blood lipids and cholesterol by exercising and minimizing your intake of saturated fat. Eat a balanced diet with plenty of fruits and vegetables. Blood tests for lipids and cholesterol should begin at age 59 and be repeated every 5 years. If your lipid or cholesterol levels are high, you are over age 77, or you are at high risk for heart disease, you may need your cholesterol levels checked more frequently.Ongoing high lipid and cholesterol levels should be treated with medicines if diet and exercise are not working.  If you smoke, find out from your health care  provider how to quit. If you do not use tobacco, do not start.  Lung cancer screening is recommended for adults aged 44-80 years who are at high risk for developing lung cancer because of a history of smoking. A yearly low-dose CT scan of the lungs is recommended for people who have at least a 30-pack-year history of smoking and are current smokers or have quit within the past 15 years. A pack year of smoking is smoking an average of 1 pack of cigarettes a day for 1 year (for example, a 30-pack-year history of smoking could mean smoking 1 pack a day for 30 years or 2 packs a day for 15 years). Yearly screening should continue until the smoker has stopped smoking for at least 15 years. Yearly screening should be stopped for people who develop a health problem that would prevent them from having lung cancer treatment.  If you choose to drink alcohol, do not have more than 2 drinks per day. One drink is considered to be 12 oz (360 mL) of beer, 5 oz (150 mL) of wine, or 1.5 oz (45 mL) of liquor.  Avoid the use of street drugs. Do not share needles with anyone. Ask for help if you need support or instructions about stopping the use of drugs.  High blood pressure causes heart disease and increases the risk of stroke. High blood pressure is more likely to develop in:  People who have blood pressure in the end of the normal range (100-139/85-89 mm Hg).  People  who are overweight or obese.  People who are African American.  If you are 56-50 years of age, have your blood pressure checked every 3-5 years. If you are 57 years of age or older, have your blood pressure checked every year. You should have your blood pressure measured twice-once when you are at a hospital or clinic, and once when you are not at a hospital or clinic. Record the average of the two measurements. To check your blood pressure when you are not at a hospital or clinic, you can use:  An automated blood pressure machine at a pharmacy.  A  home blood pressure monitor.  If you are 40-43 years old, ask your health care provider if you should take aspirin to prevent heart disease.  Diabetes screening involves taking a blood sample to check your fasting blood sugar level. This should be done once every 3 years after age 28 if you are at a normal weight and without risk factors for diabetes. Testing should be considered at a younger age or be carried out more frequently if you are overweight and have at least 1 risk factor for diabetes.  Colorectal cancer can be detected and often prevented. Most routine colorectal cancer screening begins at the age of 29 and continues through age 49. However, your health care provider may recommend screening at an earlier age if you have risk factors for colon cancer. On a yearly basis, your health care provider may provide home test kits to check for hidden blood in the stool. A small camera at the end of a tube may be used to directly examine the colon (sigmoidoscopy or colonoscopy) to detect the earliest forms of colorectal cancer. Talk to your health care provider about this at age 55 when routine screening begins. A direct exam of the colon should be repeated every 5-10 years through age 30, unless early forms of precancerous polyps or small growths are found.  People who are at an increased risk for hepatitis B should be screened for this virus. You are considered at high risk for hepatitis B if:  You were born in a country where hepatitis B occurs often. Talk with your health care provider about which countries are considered high risk.  Your parents were born in a high-risk country and you have not received a shot to protect against hepatitis B (hepatitis B vaccine).  You have HIV or AIDS.  You use needles to inject street drugs.  You live with, or have sex with, someone who has hepatitis B.  You are a man who has sex with other men (MSM).  You get hemodialysis treatment.  You take certain  medicines for conditions like cancer, organ transplantation, and autoimmune conditions.  Hepatitis C blood testing is recommended for all people born from [birth year] through 1965 and any individual with known risk factors for hepatitis C.  Healthy men should no longer receive prostate-specific antigen (PSA) blood tests as part of routine cancer screening. Talk to your health care provider about prostate cancer screening.  Testicular cancer screening is not recommended for adolescents or adult males who have no symptoms. Screening includes self-exam, a health care provider exam, and other screening tests. Consult with your health care provider about any symptoms you have or any concerns you have about testicular cancer.  Practice safe sex. Use condoms and avoid high-risk sexual practices to reduce the spread of sexually transmitted infections (STIs).  You should be screened for STIs, including gonorrhea and chlamydia if:  You are sexually active and are younger than 24 years.  You are older than 24 years, and your health care provider tells you that you are at risk for this type of infection.  Your sexual activity has changed since you were last screened, and you are at an increased risk for chlamydia or gonorrhea. Ask your health care provider if you are at risk.  If you are at risk of being infected with HIV, it is recommended that you take a prescription medicine daily to prevent HIV infection. This is called pre-exposure prophylaxis (PrEP). You are considered at risk if:  You are a man who has sex with other men (MSM).  You are a heterosexual man who is sexually active with multiple partners.  You take drugs by injection.  You are sexually active with a partner who has HIV.  Talk with your health care provider about whether you are at high risk of being infected with HIV. If you choose to begin PrEP, you should first be tested for HIV. You should then be tested every 3 months for as long as  you are taking PrEP.  Use sunscreen. Apply sunscreen liberally and repeatedly throughout the day. You should seek shade when your shadow is shorter than you. Protect yourself by wearing long sleeves, pants, a wide-brimmed hat, and sunglasses year round whenever you are outdoors.  Tell your health care provider of new moles or changes in moles, especially if there is a change in shape or color. Also, tell your health care provider if a mole is larger than the size of a pencil eraser.  A one-time screening for abdominal aortic aneurysm (AAA) and surgical repair of large AAAs by ultrasound is recommended for men aged 47-75 years who are current or former smokers.  Stay current with your vaccines (immunizations). This information is not intended to replace advice given to you by your health care provider. Make sure you discuss any questions you have with your health care provider. Document Released: 03/21/2008 Document Revised: 10/14/2014 Document Reviewed: 06/27/2015 Elsevier Interactive Patient Education  2017 [employer].

## 2016-12-12 ENCOUNTER — Other Ambulatory Visit: Payer: Self-pay | Admitting: Internal Medicine

## 2016-12-17 ENCOUNTER — Telehealth: Payer: Self-pay

## 2016-12-17 MED ORDER — PRAVASTATIN SODIUM 20 MG PO TABS: 20.0000 mg | ORAL_TABLET | Freq: Every day | ORAL | 3 refills | Status: DC

## 2016-12-17 MED ORDER — LISINOPRIL-HYDROCHLOROTHIAZIDE 20-12.5 MG PO TABS: 1.0000 | ORAL_TABLET | Freq: Every day | ORAL | 3 refills | Status: DC

## 2016-12-17 NOTE — Telephone Encounter (Signed)
Rf rq from [facility].  [facility] is not on pt.   Contacted pt and confirmed that pt wanted to go through [facility].   Erx sent

## 2017-02-19 ENCOUNTER — Other Ambulatory Visit: Payer: Self-pay | Admitting: Family

## 2017-03-23 ENCOUNTER — Other Ambulatory Visit: Payer: Self-pay | Admitting: Internal Medicine

## 2017-04-28 ENCOUNTER — Other Ambulatory Visit: Payer: Self-pay | Admitting: Internal Medicine

## 2017-04-29 NOTE — Telephone Encounter (Signed)
Please advise 

## 2017-05-17 ENCOUNTER — Other Ambulatory Visit: Payer: Self-pay | Admitting: Family

## 2017-07-21 DIAGNOSIS — H02834 Dermatochalasis of left upper eyelid: Secondary | ICD-10-CM | POA: Diagnosis not present

## 2017-07-21 DIAGNOSIS — H25813 Combined forms of age-related cataract, bilateral: Secondary | ICD-10-CM | POA: Diagnosis not present

## 2017-07-21 DIAGNOSIS — H02831 Dermatochalasis of right upper eyelid: Secondary | ICD-10-CM | POA: Diagnosis not present

## 2017-09-26 ENCOUNTER — Ambulatory Visit (INDEPENDENT_AMBULATORY_CARE_PROVIDER_SITE_OTHER): Payer: 59 | Admitting: Internal Medicine

## 2017-09-26 ENCOUNTER — Other Ambulatory Visit (INDEPENDENT_AMBULATORY_CARE_PROVIDER_SITE_OTHER): Payer: 59

## 2017-09-26 ENCOUNTER — Encounter: Payer: Self-pay | Admitting: Internal Medicine

## 2017-09-26 VITALS — BP 126/78 | HR 62 | Temp 98.1°F | Wt 281.0 lb

## 2017-09-26 DIAGNOSIS — Z23 Encounter for immunization: Secondary | ICD-10-CM

## 2017-09-26 DIAGNOSIS — M1A9XX0 Chronic gout, unspecified, without tophus (tophi): Secondary | ICD-10-CM | POA: Insufficient documentation

## 2017-09-26 DIAGNOSIS — E785 Hyperlipidemia, unspecified: Secondary | ICD-10-CM

## 2017-09-26 DIAGNOSIS — M109 Gout, unspecified: Secondary | ICD-10-CM | POA: Diagnosis not present

## 2017-09-26 DIAGNOSIS — I10 Essential (primary) hypertension: Secondary | ICD-10-CM | POA: Diagnosis not present

## 2017-09-26 DIAGNOSIS — Z0000 Encounter for general adult medical examination without abnormal findings: Secondary | ICD-10-CM

## 2017-09-26 LAB — COMPREHENSIVE METABOLIC PANEL
ALBUMIN: 4.1 g/dL (ref 3.5–5.2)
ALK PHOS: 45 U/L (ref 39–117)
ALT: 17 U/L (ref 0–53)
AST: 13 U/L (ref 0–37)
BUN: 23 mg/dL (ref 6–23)
CHLORIDE: 102 meq/L (ref 96–112)
CO2: 30 mEq/L (ref 19–32)
CREATININE: 1.85 mg/dL — AB (ref 0.40–1.50)
Calcium: 9.5 mg/dL (ref 8.4–10.5)
GFR: 40.01 mL/min — ABNORMAL LOW (ref >60.00)
GLUCOSE: 102 mg/dL — AB (ref 70–99)
POTASSIUM: 4.1 meq/L (ref 3.5–5.1)
SODIUM: 137 meq/L (ref 135–145)
TOTAL PROTEIN: 7.6 g/dL (ref 6.0–8.3)
Total Bilirubin: 0.4 mg/dL (ref 0.2–1.2)

## 2017-09-26 LAB — CBC
HEMATOCRIT: 39.6 % (ref 39.0–52.0)
Hemoglobin: 12.8 g/dL — ABNORMAL LOW (ref 13.0–17.0)
MCHC: 32.4 g/dL (ref 30.0–36.0)
MCV: 88.2 fl (ref 78.0–100.0)
Platelets: 296 10*3/uL (ref 150.0–400.0)
RBC: 4.49 Mil/uL (ref 4.22–5.81)
RDW: 13.6 % (ref 11.5–15.5)
WBC: 7.5 10*3/uL (ref 4.0–10.5)

## 2017-09-26 LAB — LDL CHOLESTEROL, DIRECT: LDL DIRECT: 97 mg/dL

## 2017-09-26 LAB — LIPID PANEL
CHOL/HDL RATIO: 5
Cholesterol: 145 mg/dL (ref 0–200)
HDL: 30.9 mg/dL — ABNORMAL LOW (ref >39.00)
NonHDL: 113.78
Triglycerides: 201 mg/dL — ABNORMAL HIGH (ref 0.0–149.0)
VLDL: 40.2 mg/dL — AB (ref 0.0–40.0)

## 2017-09-26 LAB — HEMOGLOBIN A1C: HEMOGLOBIN A1C: 6.6 % — AB (ref 4.6–6.5)

## 2017-09-26 MED ORDER — PREDNISONE 20 MG PO TABS: 40.0000 mg | ORAL_TABLET | Freq: Every day | ORAL | 0 refills | Status: DC

## 2017-09-26 NOTE — Assessment & Plan Note (Signed)
Colonoscopy up to date, flu shot given at visit. Tetanus up to date. Put on shingrix waiting list. Counseled about sun safety and dangers of distracted driving. Given screening recommendations.

## 2017-09-26 NOTE — Patient Instructions (Signed)
We will put you on the waiting list for the shingles vaccine.  We have given you the flu shot today and checking the labs.  We have sent in prednisone. Take 2 pills daily for 5 days to clear this up.   Low-Purine Diet Purines are compounds that affect the level of uric acid in your body. A low-purine diet is a diet that is low in purines. Eating a low-purine diet can prevent the level of uric acid in your body from getting too high and causing gout or kidney stones or both. What do I need to know about this diet?  Choose low-purine foods. Examples of low-purine foods are listed in the next section.  Drink plenty of fluids, especially water. Fluids can help remove uric acid from your body. Try to drink 8-16 cups (1.9-3.8 L) a day.  Limit foods high in fat, especially saturated fat, as fat makes it harder for the body to get rid of uric acid. Foods high in saturated fat include pizza, cheese, ice cream, whole milk, fried foods, and gravies. Choose foods that are lower in fat and lean sources of protein. Use olive oil when cooking as it contains healthy fats that are not high in saturated fat.  Limit alcohol. Alcohol interferes with the elimination of uric acid from your body. If you are having a gout attack, avoid all alcohol.  Keep in mind that different people's bodies react differently to different foods. You will probably learn over time which foods do or do not affect you. If you discover that a food tends to cause your gout to flare up, avoid eating that food. You can more freely enjoy foods that do not cause problems. If you have any questions about a food item, talk to your dietitian or health care provider. Which foods are low, moderate, and high in purines? The following is a list of foods that are low, moderate, and high in purines. You can eat any amount of the foods that are low in purines. You may be able to have small amounts of foods that are moderate in purines. Ask your health  care provider how much of a food moderate in purines you can have. Avoid foods high in purines. Grains  Foods low in purines: Enriched white bread, pasta, rice, cake, cornbread, popcorn.  Foods moderate in purines: Whole-grain breads and cereals, wheat germ, bran, oatmeal. Uncooked oatmeal. Dry wheat bran or wheat germ.  Foods high in purines: Pancakes, [place] toast, biscuits, muffins. Vegetables  Foods low in purines: All vegetables, except those that are moderate in purines.  Foods moderate in purines: Asparagus, cauliflower, spinach, mushrooms, green peas. Fruits  All fruits are low in purines. Meats and other Protein Foods  Foods low in purines: Eggs, nuts, peanut butter.  Foods moderate in purines: 80-90% lean beef, lamb, veal, pork, poultry, fish, eggs, peanut butter, nuts. Crab, lobster, oysters, and shrimp. Cooked dried beans, peas, and lentils.  Foods high in purines: Anchovies, sardines, herring, mussels, tuna, codfish, scallops, trout, and haddock. Berniece Salines. Organ meats (such as liver or kidney). Tripe. Game meat. Goose. Sweetbreads. Dairy  All dairy foods are low in purines. Low-fat and fat-free dairy products are best because they are low in saturated fat. Beverages  Drinks low in purines: Water, carbonated beverages, tea, coffee, cocoa.  Drinks moderate in purines: Soft drinks and other drinks sweetened with high-fructose corn syrup. Juices. To find whether a food or drink is sweetened with high-fructose corn syrup, look at the ingredients  list.  Drinks high in purines: Alcoholic beverages (such as beer). Condiments  Foods low in purines: Salt, herbs, olives, pickles, relishes, vinegar.  Foods moderate in purines: Butter, margarine, oils, mayonnaise. Fats and Oils  Foods low in purines: All types, except gravies and sauces made with meat.  Foods high in purines: Gravies and sauces made with meat. Other Foods  Foods low in purines: Sugars, sweets, gelatin. Cake.  Soups made without meat.  Foods moderate in purines: Meat-based or fish-based soups, broths, or bouillons. Foods and drinks sweetened with high-fructose corn syrup.  Foods high in purines: High-fat desserts (such as ice cream, cookies, cakes, pies, doughnuts, and chocolate). Contact your dietitian for more information on foods that are not listed here. This information is not intended to replace advice given to you by your health care provider. Make sure you discuss any questions you have with your health care provider. Document Released: 01/18/2011 Document Revised: 02/29/2016 Document Reviewed: 08/30/2013 Elsevier Interactive Patient Education  2017 [employer] Maintenance, Male A healthy lifestyle and preventive care is important for your health and wellness. Ask your health care provider about what schedule of regular examinations is right for you. What should I know about weight and diet? Eat a Healthy Diet  Eat plenty of vegetables, fruits, whole grains, low-fat dairy products, and lean protein.  Do not eat a lot of foods high in solid fats, added sugars, or salt.  Maintain a Healthy Weight Regular exercise can help you achieve or maintain a healthy weight. You should:  Do at least 150 minutes of exercise each week. The exercise should increase your heart rate and make you sweat (moderate-intensity exercise).  Do strength-training exercises at least twice a week.  Watch Your Levels of Cholesterol and Blood Lipids  Have your blood tested for lipids and cholesterol every 5 years starting at 58 years of age. If you are at high risk for heart disease, you should start having your blood tested when you are 58 years old. You may need to have your cholesterol levels checked more often if: ? Your lipid or cholesterol levels are high. ? You are older than 58 years of age. ? You are at high risk for heart disease.  What should I know about cancer screening? Many types of  cancers can be detected early and may often be prevented. Lung Cancer  You should be screened every year for lung cancer if: ? You are a current smoker who has smoked for at least 30 years. ? You are a former smoker who has quit within the past 15 years.  Talk to your health care provider about your screening options, when you should start screening, and how often you should be screened.  Colorectal Cancer  Routine colorectal cancer screening usually begins at 58 years of age and should be repeated every 5-10 years until you are 58 years old. You may need to be screened more often if early forms of precancerous polyps or small growths are found. Your health care provider may recommend screening at an earlier age if you have risk factors for colon cancer.  Your health care provider may recommend using home test kits to check for hidden blood in the stool.  A small camera at the end of a tube can be used to examine your colon (sigmoidoscopy or colonoscopy). This checks for the earliest forms of colorectal cancer.  Prostate and Testicular Cancer  Depending on your age and overall health, your health  care provider may do certain tests to screen for prostate and testicular cancer.  Talk to your health care provider about any symptoms or concerns you have about testicular or prostate cancer.  Skin Cancer  Check your skin from head to toe regularly.  Tell your health care provider about any new moles or changes in moles, especially if: ? There is a change in a mole's size, shape, or color. ? You have a mole that is larger than a pencil eraser.  Always use sunscreen. Apply sunscreen liberally and repeat throughout the day.  Protect yourself by wearing long sleeves, pants, a wide-brimmed hat, and sunglasses when outside.  What should I know about heart disease, diabetes, and high blood pressure?  If you are 72-70 years of age, have your blood pressure checked every 3-5 years. If you are  31 years of age or older, have your blood pressure checked every year. You should have your blood pressure measured twice-once when you are at a hospital or clinic, and once when you are not at a hospital or clinic. Record the average of the two measurements. To check your blood pressure when you are not at a hospital or clinic, you can use: ? An automated blood pressure machine at a pharmacy. ? A home blood pressure monitor.  Talk to your health care provider about your target blood pressure.  If you are between 28-66 years old, ask your health care provider if you should take aspirin to prevent heart disease.  Have regular diabetes screenings by checking your fasting blood sugar level. ? If you are at a normal weight and have a low risk for diabetes, have this test once every three years after the age of 43. ? If you are overweight and have a high risk for diabetes, consider being tested at a younger age or more often.  A one-time screening for abdominal aortic aneurysm (AAA) by ultrasound is recommended for men aged 25-75 years who are current or former smokers. What should I know about preventing infection? Hepatitis B If you have a higher risk for hepatitis B, you should be screened for this virus. Talk with your health care provider to find out if you are at risk for hepatitis B infection. Hepatitis C Blood testing is recommended for:  Everyone born from [birth year] through 1965.  Anyone with known risk factors for hepatitis C.  Sexually Transmitted Diseases (STDs)  You should be screened each year for STDs including gonorrhea and chlamydia if: ? You are sexually active and are younger than 58 years of age. ? You are older than 58 years of age and your health care provider tells you that you are at risk for this type of infection. ? Your sexual activity has changed since you were last screened and you are at an increased risk for chlamydia or gonorrhea. Ask your health care provider if you  are at risk.  Talk with your health care provider about whether you are at high risk of being infected with HIV. Your health care provider may recommend a prescription medicine to help prevent HIV infection.  What else can I do?  Schedule regular health, dental, and eye exams.  Stay current with your vaccines (immunizations).  Do not use any tobacco products, such as cigarettes, chewing tobacco, and e-cigarettes. If you need help quitting, ask your health care provider.  Limit alcohol intake to no more than 2 drinks per day. One drink equals 12 ounces of beer, 5 ounces of wine,  or 1 ounces of hard liquor.  Do not use street drugs.  Do not share needles.  Ask your health care provider for help if you need support or information about quitting drugs.  Tell your health care provider if you often feel depressed.  Tell your health care provider if you have ever been abused or do not feel safe at home. This information is not intended to replace advice given to you by your health care provider. Make sure you discuss any questions you have with your health care provider. Document Released: 03/21/2008 Document Revised: 05/22/2016 Document Reviewed: 06/27/2015 Elsevier Interactive Patient Education  [employer].

## 2017-09-26 NOTE — Assessment & Plan Note (Signed)
Rx for prednisone 5 day course. Given dietary information and he has had several trigger foods recently. First flare so if recurrence can change hctz.

## 2017-09-26 NOTE — Assessment & Plan Note (Signed)
BP at goal on his lisinopril/hctz. Checking CMP and adjust as needed.

## 2017-09-26 NOTE — Assessment & Plan Note (Signed)
Checking lipid panel and adjust pravastatin as needed. Goal LDL <130.

## 2017-09-26 NOTE — Progress Notes (Signed)
   Subjective:    Patient ID: Terry Cummings, male    DOB: [birth year], 56 y.o.   MRN: [id number]  HPI The patient is a 56 YO man coming in for physical. coming in for physical.   PMH, [facility], social history reviewed and updated.   Review of Systems  Constitutional: Negative.   HENT: Negative.   Eyes: Negative.   Respiratory: Negative for cough, chest tightness and shortness of breath.   Cardiovascular: Negative for chest pain, palpitations and leg swelling.  Gastrointestinal: Negative for abdominal distention, abdominal pain, constipation, diarrhea, nausea and vomiting.  Musculoskeletal: Positive for arthralgias.  Skin: Negative.   Neurological: Negative.   Psychiatric/Behavioral: Negative.       Objective:   Physical Exam  Constitutional: He is oriented to person, place, and time. He appears well-developed and well-nourished.  Overweight  HENT:  Head: Normocephalic and atraumatic.  Eyes: EOM are normal.  Neck: Normal range of motion.  Cardiovascular: Normal rate and regular rhythm.  Pulmonary/Chest: Effort normal and breath sounds normal. No respiratory distress. He has no wheezes. He has no rales.  Abdominal: Soft. Bowel sounds are normal. He exhibits no distension. There is no tenderness. There is no rebound.  Musculoskeletal: He exhibits tenderness. He exhibits no edema.  Redness and pain the MTP joint left foot  Neurological: He is alert and oriented to person, place, and time. Coordination normal.  Skin: Skin is warm and dry.  Psychiatric: He has a normal mood and affect.   Vitals:   09/26/17 0854  BP: 126/78  Pulse: 62  Temp: 98.1 F (36.7 C)  SpO2: 99%  Weight: 281 lb (127.5 kg)      Assessment & Plan:  Flu shot given at visit

## 2017-10-06 ENCOUNTER — Telehealth: Payer: Self-pay | Admitting: Internal Medicine

## 2017-10-06 NOTE — Telephone Encounter (Signed)
LOV 09/26/17 with Dr. Sharlet Salina / Patient is asking for prednisone refill for a gout flare /

## 2017-10-06 NOTE — Telephone Encounter (Signed)
Copied from [place] [id number]. Topic: Quick Communication - Rx Refill/Question >> Oct 06, 2017 10:59 AM Robina Ade, Helene Kelp D wrote: Has the patient contacted their pharmacy? Yes (Agent: If no, request that the patient contact the pharmacy for the refill.) Preferred Pharmacy (with phone number or street name): [id number] - [place], [place]. Agent: Please be advised that RX refills may take up to 3 business days. We ask that you follow-up with your pharmacy. Patient needs another round of predniSONE (DELTASONE) 20 MG tablet. He said that the gout came back.

## 2017-10-08 ENCOUNTER — Other Ambulatory Visit: Payer: Self-pay | Admitting: Internal Medicine

## 2017-10-08 DIAGNOSIS — M1000 Idiopathic gout, unspecified site: Secondary | ICD-10-CM

## 2017-10-08 MED ORDER — COLCHICINE 0.6 MG PO CAPS: 1.0000 | ORAL_CAPSULE | Freq: Two times a day (BID) | ORAL | 0 refills | Status: DC

## 2017-10-08 MED ORDER — METHYLPREDNISOLONE 4 MG PO TBPK: ORAL_TABLET | ORAL | 0 refills | Status: DC

## 2017-10-08 NOTE — Telephone Encounter (Signed)
Not[facility].../lmb 

## 2017-10-08 NOTE — Telephone Encounter (Signed)
MD is out if office pls advise on msg below...Johny Chess

## 2017-10-08 NOTE — Telephone Encounter (Signed)
Prescriptions for steroids and colchicine were sent to his pharmacy to treat the gout flareup

## 2017-12-05 ENCOUNTER — Other Ambulatory Visit: Payer: Self-pay | Admitting: Internal Medicine

## 2017-12-31 ENCOUNTER — Ambulatory Visit (INDEPENDENT_AMBULATORY_CARE_PROVIDER_SITE_OTHER): Payer: 59 | Admitting: *Deleted

## 2017-12-31 DIAGNOSIS — Z23 Encounter for immunization: Secondary | ICD-10-CM

## 2018-03-04 ENCOUNTER — Ambulatory Visit (INDEPENDENT_AMBULATORY_CARE_PROVIDER_SITE_OTHER): Payer: 59

## 2018-03-04 DIAGNOSIS — Z299 Encounter for prophylactic measures, unspecified: Secondary | ICD-10-CM | POA: Diagnosis not present

## 2018-03-06 DIAGNOSIS — L259 Unspecified contact dermatitis, unspecified cause: Secondary | ICD-10-CM | POA: Diagnosis not present

## 2018-03-06 DIAGNOSIS — D229 Melanocytic nevi, unspecified: Secondary | ICD-10-CM | POA: Diagnosis not present

## 2018-07-23 DIAGNOSIS — H02832 Dermatochalasis of right lower eyelid: Secondary | ICD-10-CM | POA: Diagnosis not present

## 2018-07-23 DIAGNOSIS — H25813 Combined forms of age-related cataract, bilateral: Secondary | ICD-10-CM | POA: Diagnosis not present

## 2018-07-23 DIAGNOSIS — H02831 Dermatochalasis of right upper eyelid: Secondary | ICD-10-CM | POA: Diagnosis not present

## 2018-08-17 ENCOUNTER — Other Ambulatory Visit: Payer: Self-pay | Admitting: Internal Medicine

## 2018-08-27 ENCOUNTER — Other Ambulatory Visit (INDEPENDENT_AMBULATORY_CARE_PROVIDER_SITE_OTHER): Payer: 59

## 2018-08-27 ENCOUNTER — Ambulatory Visit (INDEPENDENT_AMBULATORY_CARE_PROVIDER_SITE_OTHER): Payer: 59 | Admitting: Internal Medicine

## 2018-08-27 ENCOUNTER — Encounter: Payer: Self-pay | Admitting: Internal Medicine

## 2018-08-27 VITALS — BP 120/80 | HR 63 | Temp 98.5°F | Ht 68.0 in | Wt 255.0 lb

## 2018-08-27 DIAGNOSIS — M1000 Idiopathic gout, unspecified site: Secondary | ICD-10-CM

## 2018-08-27 DIAGNOSIS — Z0000 Encounter for general adult medical examination without abnormal findings: Secondary | ICD-10-CM

## 2018-08-27 DIAGNOSIS — I10 Essential (primary) hypertension: Secondary | ICD-10-CM | POA: Diagnosis not present

## 2018-08-27 DIAGNOSIS — E785 Hyperlipidemia, unspecified: Secondary | ICD-10-CM

## 2018-08-27 DIAGNOSIS — Z23 Encounter for immunization: Secondary | ICD-10-CM

## 2018-08-27 DIAGNOSIS — R7301 Impaired fasting glucose: Secondary | ICD-10-CM | POA: Insufficient documentation

## 2018-08-27 LAB — LIPID PANEL
CHOL/HDL RATIO: 5
Cholesterol: 184 mg/dL (ref 0–200)
HDL: 35.5 mg/dL — ABNORMAL LOW (ref >39.00)
LDL CALC: 118 mg/dL — AB (ref 0–99)
NONHDL: 148.75
Triglycerides: 153 mg/dL — ABNORMAL HIGH (ref 0.0–149.0)
VLDL: 30.6 mg/dL (ref 0.0–40.0)

## 2018-08-27 LAB — CBC
HCT: 41.5 % (ref 39.0–52.0)
HEMOGLOBIN: 13.9 g/dL (ref 13.0–17.0)
MCHC: 33.6 g/dL (ref 30.0–36.0)
MCV: 87.2 fl (ref 78.0–100.0)
Platelets: 233 10*3/uL (ref 150.0–400.0)
RBC: 4.76 Mil/uL (ref 4.22–5.81)
RDW: 13.6 % (ref 11.5–15.5)
WBC: 6 10*3/uL (ref 4.0–10.5)

## 2018-08-27 LAB — PSA: PSA: 0.47 ng/mL (ref 0.10–4.00)

## 2018-08-27 LAB — COMPREHENSIVE METABOLIC PANEL
ALK PHOS: 39 U/L (ref 39–117)
ALT: 33 U/L (ref 0–53)
AST: 19 U/L (ref 0–37)
Albumin: 4.3 g/dL (ref 3.5–5.2)
BILIRUBIN TOTAL: 0.4 mg/dL (ref 0.2–1.2)
BUN: 21 mg/dL (ref 6–23)
CO2: 29 meq/L (ref 19–32)
Calcium: 9.8 mg/dL (ref 8.4–10.5)
Chloride: 104 mEq/L (ref 96–112)
Creatinine, Ser: 1.67 mg/dL — ABNORMAL HIGH (ref 0.40–1.50)
GFR: 44.89 mL/min — ABNORMAL LOW (ref >60.00)
GLUCOSE: 101 mg/dL — AB (ref 70–99)
Potassium: 4.4 mEq/L (ref 3.5–5.1)
Sodium: 141 mEq/L (ref 135–145)
TOTAL PROTEIN: 7 g/dL (ref 6.0–8.3)

## 2018-08-27 LAB — HEMOGLOBIN A1C: Hgb A1c MFr Bld: 6.3 % (ref 4.6–6.5)

## 2018-08-27 LAB — URIC ACID: URIC ACID, SERUM: 10 mg/dL — AB (ref 4.0–7.8)

## 2018-08-27 NOTE — Progress Notes (Signed)
   Subjective:    Patient ID: Terry Cummings, male    DOB: 1961/05/12, 57 y.o.   MRN: [id number]  HPI The patient is a 57 YO man coming in for physical.   PMH, [place], social history reviewed and updated  Review of Systems  Constitutional: Negative.   HENT: Negative.   Eyes: Negative.   Respiratory: Negative for cough, chest tightness and shortness of breath.   Cardiovascular: Negative for chest pain, palpitations and leg swelling.  Gastrointestinal: Negative for abdominal distention, abdominal pain, constipation, diarrhea, nausea and vomiting.  Musculoskeletal: Negative.   Skin: Negative.   Neurological: Negative.   Psychiatric/Behavioral: Negative.       Objective:   Physical Exam  Constitutional: He is oriented to person, place, and time. He appears well-developed and well-nourished.  HENT:  Head: Normocephalic and atraumatic.  Eyes: EOM are normal.  Neck: Normal range of motion.  Cardiovascular: Normal rate and regular rhythm.  Pulmonary/Chest: Effort normal and breath sounds normal. No respiratory distress. He has no wheezes. He has no rales.  Abdominal: Soft. Bowel sounds are normal. He exhibits no distension. There is no tenderness. There is no rebound.  Musculoskeletal: He exhibits no edema.  Neurological: He is alert and oriented to person, place, and time. Coordination normal.  Skin: Skin is warm and dry.  Psychiatric: He has a normal mood and affect.   Vitals:   08/27/18 0959  BP: 120/80  Pulse: 63  Temp: 98.5 F (36.9 C)  TempSrc: Oral  SpO2: 99%  Weight: 255 lb (115.7 kg)  Height: 5\' 8"  (1.727 m)      Assessment & Plan:  Flu shot given at visit

## 2018-08-27 NOTE — Assessment & Plan Note (Signed)
BMI 07.1 but complicated by impaired sugars and hyperlipidemia and hypertension. We have discussed the need to focus on weight loss but he does not feel able to do that at this time.

## 2018-08-27 NOTE — Assessment & Plan Note (Signed)
Checking uric acid. Using colchicine rarely.

## 2018-08-27 NOTE — Assessment & Plan Note (Signed)
Last Hga1c elevated and needs to be verified, if so then he has new diagnosis of diabetes. Already on ACE-I and statin.

## 2018-08-27 NOTE — Assessment & Plan Note (Signed)
BP at goal on lisinopril/hctz 20/12.5 mg daily.

## 2018-08-27 NOTE — Assessment & Plan Note (Signed)
Taking pravastatin 20 mg daily and checking lipid panel and adjust as needed.

## 2018-08-27 NOTE — Patient Instructions (Addendum)
Work on stopping the chewing tobacco to reduce your risk of heart attack for the future.   Health Maintenance, Male A healthy lifestyle and preventive care is important for your health and wellness. Ask your health care provider about what schedule of regular examinations is right for you. What should I know about weight and diet? Eat a Healthy Diet  Eat plenty of vegetables, fruits, whole grains, low-fat dairy products, and lean protein.  Do not eat a lot of foods high in solid fats, added sugars, or salt.  Maintain a Healthy Weight Regular exercise can help you achieve or maintain a healthy weight. You should:  Do at least 150 minutes of exercise each week. The exercise should increase your heart rate and make you sweat (moderate-intensity exercise).  Do strength-training exercises at least twice a week.  Watch Your Levels of Cholesterol and Blood Lipids  Have your blood tested for lipids and cholesterol every 5 years starting at 59 years of age. If you are at high risk for heart disease, you should start having your blood tested when you are 59 years old. You may need to have your cholesterol levels checked more often if: ? Your lipid or cholesterol levels are high. ? You are older than 59 years of age. ? You are at high risk for heart disease.  What should I know about cancer screening? Many types of cancers can be detected early and may often be prevented. Lung Cancer  You should be screened every year for lung cancer if: ? You are a current smoker who has smoked for at least 30 years. ? You are a former smoker who has quit within the past 15 years.  Talk to your health care provider about your screening options, when you should start screening, and how often you should be screened.  Colorectal Cancer  Routine colorectal cancer screening usually begins at 59 years of age and should be repeated every 5-10 years until you are 59 years old. You may need to be screened more  often if early forms of precancerous polyps or small growths are found. Your health care provider may recommend screening at an earlier age if you have risk factors for colon cancer.  Your health care provider may recommend using home test kits to check for hidden blood in the stool.  A small camera at the end of a tube can be used to examine your colon (sigmoidoscopy or colonoscopy). This checks for the earliest forms of colorectal cancer.  Prostate and Testicular Cancer  Depending on your age and overall health, your health care provider may do certain tests to screen for prostate and testicular cancer.  Talk to your health care provider about any symptoms or concerns you have about testicular or prostate cancer.  Skin Cancer  Check your skin from head to toe regularly.  Tell your health care provider about any new moles or changes in moles, especially if: ? There is a change in a mole's size, shape, or color. ? You have a mole that is larger than a pencil eraser.  Always use sunscreen. Apply sunscreen liberally and repeat throughout the day.  Protect yourself by wearing long sleeves, pants, a wide-brimmed hat, and sunglasses when outside.  What should I know about heart disease, diabetes, and high blood pressure?  If you are 45-40 years of age, have your blood pressure checked every 3-5 years. If you are 30 years of age or older, have your blood pressure checked every year. You  should have your blood pressure measured twice-once when you are at a hospital or clinic, and once when you are not at a hospital or clinic. Record the average of the two measurements. To check your blood pressure when you are not at a hospital or clinic, you can use: ? An automated blood pressure machine at a pharmacy. ? A home blood pressure monitor.  Talk to your health care provider about your target blood pressure.  If you are between 22-38 years old, ask your health care provider if you should take  aspirin to prevent heart disease.  Have regular diabetes screenings by checking your fasting blood sugar level. ? If you are at a normal weight and have a low risk for diabetes, have this test once every three years after the age of 2. ? If you are overweight and have a high risk for diabetes, consider being tested at a younger age or more often.  A one-time screening for abdominal aortic aneurysm (AAA) by ultrasound is recommended for men aged 37-75 years who are current or former smokers. What should I know about preventing infection? Hepatitis B If you have a higher risk for hepatitis B, you should be screened for this virus. Talk with your health care provider to find out if you are at risk for hepatitis B infection. Hepatitis C Blood testing is recommended for:  Everyone born from [birth year] through 1967.  Anyone with known risk factors for hepatitis C.  Sexually Transmitted Diseases (STDs)  You should be screened each year for STDs including gonorrhea and chlamydia if: ? You are sexually active and are younger than 59 years of age. ? You are older than 59 years of age and your health care provider tells you that you are at risk for this type of infection. ? Your sexual activity has changed since you were last screened and you are at an increased risk for chlamydia or gonorrhea. Ask your health care provider if you are at risk.  Talk with your health care provider about whether you are at high risk of being infected with HIV. Your health care provider may recommend a prescription medicine to help prevent HIV infection.  What else can I do?  Schedule regular health, dental, and eye exams.  Stay current with your vaccines (immunizations).  Do not use any tobacco products, such as cigarettes, chewing tobacco, and e-cigarettes. If you need help quitting, ask your health care provider.  Limit alcohol intake to no more than 2 drinks per day. One drink equals 12 ounces of beer, 5 ounces of  wine, or 1 ounces of hard liquor.  Do not use street drugs.  Do not share needles.  Ask your health care provider for help if you need support or information about quitting drugs.  Tell your health care provider if you often feel depressed.  Tell your health care provider if you have ever been abused or do not feel safe at home. This information is not intended to replace advice given to you by your health care provider. Make sure you discuss any questions you have with your health care provider. Document Released: 03/21/2008 Document Revised: 05/22/2016 Document Reviewed: 06/27/2015 Elsevier Interactive Patient Education  [employer].

## 2018-08-27 NOTE — Assessment & Plan Note (Signed)
Flu shot given. Shingrix complete. Tetanus up to date. Colonoscopy up to date. Counseled about sun safety and mole surveillance. Counseled about the dangers of distracted driving. Given 10 year screening recommendations.   

## 2018-08-31 ENCOUNTER — Other Ambulatory Visit: Payer: Self-pay

## 2018-08-31 ENCOUNTER — Other Ambulatory Visit: Payer: Self-pay | Admitting: Internal Medicine

## 2018-08-31 DIAGNOSIS — M1000 Idiopathic gout, unspecified site: Secondary | ICD-10-CM

## 2018-08-31 MED ORDER — COLCHICINE 0.6 MG PO CAPS: 1.0000 | ORAL_CAPSULE | Freq: Two times a day (BID) | ORAL | 0 refills | Status: DC

## 2018-08-31 MED ORDER — ALLOPURINOL 300 MG PO TABS: 300.0000 mg | ORAL_TABLET | Freq: Every day | ORAL | 3 refills | Status: DC

## 2018-09-04 ENCOUNTER — Ambulatory Visit: Payer: Self-pay | Admitting: *Deleted

## 2018-09-04 NOTE — Telephone Encounter (Signed)
Contacted pt and reviewed information per Dr Sharlet Salina on 08/31/18; "Sent in allopurinol to take 1 pill daily. Should come back to lab for blood work 1 month after starting and we may adjust dose then. Should continue taking colchicine also until that blood work in 1 month."; he verbalized understanding and will take the medication as ordered Allopurinol 300 mg daily and Colchicine 0.6 mg 1 tablet, 2 times daily; the pt verbalized understanding; will route to office for notification of this encounter.    Reason for Disposition . Caller has medication question only, adult not sick, and triager answers question  Answer Assessment - Initial Assessment Questions 1. SYMPTOMS: "Do you have any symptoms?"     no 2. SEVERITY: If symptoms are present, ask "Are they mild, moderate or severe?"     n/a  Protocols used: MEDICATION QUESTION CALL-A-AH

## 2018-09-07 NOTE — Telephone Encounter (Signed)
noted 

## 2018-10-08 ENCOUNTER — Other Ambulatory Visit (INDEPENDENT_AMBULATORY_CARE_PROVIDER_SITE_OTHER): Payer: 59

## 2018-10-08 DIAGNOSIS — M1000 Idiopathic gout, unspecified site: Secondary | ICD-10-CM | POA: Diagnosis not present

## 2018-10-08 LAB — URIC ACID: URIC ACID, SERUM: 4.9 mg/dL (ref 4.0–7.8)

## 2018-12-02 ENCOUNTER — Other Ambulatory Visit: Payer: Self-pay | Admitting: Internal Medicine

## 2019-05-15 ENCOUNTER — Other Ambulatory Visit: Payer: Self-pay | Admitting: Internal Medicine

## 2019-05-15 DIAGNOSIS — M1000 Idiopathic gout, unspecified site: Secondary | ICD-10-CM

## 2019-05-24 ENCOUNTER — Ambulatory Visit (INDEPENDENT_AMBULATORY_CARE_PROVIDER_SITE_OTHER): Payer: 59 | Admitting: Family

## 2019-05-24 ENCOUNTER — Encounter: Payer: Self-pay | Admitting: Family

## 2019-05-24 ENCOUNTER — Other Ambulatory Visit: Payer: Self-pay

## 2019-05-24 VITALS — BP 118/76 | HR 70 | Temp 98.3°F | Ht 68.0 in | Wt 253.0 lb

## 2019-05-24 DIAGNOSIS — L309 Dermatitis, unspecified: Secondary | ICD-10-CM | POA: Diagnosis not present

## 2019-05-24 DIAGNOSIS — M79671 Pain in right foot: Secondary | ICD-10-CM

## 2019-05-24 MED ORDER — TRIAMCINOLONE ACETONIDE 0.1 % EX CREA: 1.0000 "application " | TOPICAL_CREAM | Freq: Two times a day (BID) | CUTANEOUS | 0 refills | Status: DC

## 2019-05-24 NOTE — Progress Notes (Signed)
Terry Cummings  Terry Cummings is a 58 y.o. male with the following history as recorded in [employer]:  Patient Active Problem List   Diagnosis Date Noted  . Impaired fasting blood sugar 08/27/2018  . Acute gout 09/26/2017  . Routine general medical examination at a health care facility 09/23/2016  . Essential hypertension 01/26/2016  . Allergic rhinitis 01/26/2016  . Hyperlipidemia 10/04/2009  . Morbid obesity ([place]) 10/04/2009    Current Outpatient Medications  Medication Sig Dispense Refill  . allopurinol (ZYLOPRIM) 300 MG tablet Take 1 tablet (300 mg total) by mouth daily. 90 tablet 3  . aspirin 81 MG tablet Take 81 mg by mouth daily.    . Colchicine (MITIGARE) 0.6 MG CAPS Take 1 tablet by mouth 2 (two) times daily. 180 capsule 0  . cyclobenzaprine (FLEXERIL) 5 MG tablet TAKE 1 TABLET BY MOUTH THREE TIMES A DAY AS NEEDED FOR MUSCLE SPASMS 60 tablet 0  . lisinopril-hydrochlorothiazide (PRINZIDE,ZESTORETIC) 20-12.5 MG tablet TAKE 1 TABLET BY MOUTH  DAILY 90 tablet 3  . pravastatin (PRAVACHOL) 20 MG tablet TAKE 1 TABLET BY MOUTH  DAILY 90 tablet 3  . triamcinolone cream (KENALOG) 0.1 % Apply 1 application topically 2 (two) times daily. 30 g 0   No current facility-administered medications for this visit.     Allergies: Codeine and Neomycin-bacitracin zn-polymyx  Past Medical History:  Diagnosis Date  . Atypical nevus 07/26/2003   Upper Center Back-Slight to Moderate (w/s)  . Backache, unspecified   . Hypertension   . Obesity, unspecified   . Other and unspecified hyperlipidemia   . Pain in joint, lower leg   . Personal history of urinary calculi     Past Surgical History:  Procedure Laterality Date  . excision of prepatellar burs    . IRRIGATION AND DEBRIDEMENT, open, of infected prepatellar bursa    . TONSILLECTOMY      Family History  Problem Relation Age of Onset  . Alcohol abuse Father   . Cirrhosis Father   . Brain cancer Father     Social History   Tobacco Use  . Smoking  status: Never Smoker  . Smokeless tobacco: Current User    Types: Chew  Substance Use Topics  . Alcohol use: No    Subjective:  1 week history of "itchy" rash on both ankles; does have some pain localized in his ankles; does not feel like a gout flare; denies any new soaps, foods, detergents or medications.  Has tried using some type of cream his dermatologist gave him with little benefit;   Also requesting referral to our sports medicine provider- having right heel pain when walking/ stepping down;     Objective:  Vitals:   05/24/19 1503  BP: 118/76  Pulse: 70  Temp: 98.3 F (36.8 C)  TempSrc: Oral  SpO2: 95%  Weight: 253 lb (114.8 kg)  Height: 5\' 8"  (1.727 m)    General: Well developed, well nourished, in no acute distress  Skin : Warm and dry. Localized area of redness over both ankles; no warmth or streaking;  Head: Normocephalic and atraumatic  Lungs: Respirations unlabored;  Musculoskeletal: No deformities; no active joint inflammation  Extremities: No edema, cyanosis, clubbing; no swelling or pain noted in either calves  Vessels: Symmetric bilaterally  Neurologic: Alert and oriented; speech intact; face symmetrical; moves all extremities well; CNII-XII intact without focal deficit   Assessment:  1. Dermatitis   2. Pain of right heel     Plan:  1. ? If  patient's work boots affecting circulation; areas almost appear like a venous stasis dermatitis; trial of Triamcinolone; 2. Follow-up with Dr. Tamala Julian;   Return for Dr. Tamala Julian right heel pain x 1 month.  No orders of the defined types were placed in this encounter.   Requested Prescriptions   Signed Prescriptions Disp Refills  . triamcinolone cream (KENALOG) 0.1 % 30 g 0    Sig: Apply 1 application topically 2 (two) times daily.

## 2019-06-24 ENCOUNTER — Ambulatory Visit (INDEPENDENT_AMBULATORY_CARE_PROVIDER_SITE_OTHER): Payer: 59 | Admitting: Family Medicine

## 2019-06-24 ENCOUNTER — Ambulatory Visit: Payer: Self-pay

## 2019-06-24 ENCOUNTER — Encounter: Payer: Self-pay | Admitting: Family Medicine

## 2019-06-24 ENCOUNTER — Other Ambulatory Visit: Payer: Self-pay

## 2019-06-24 VITALS — BP 126/92 | HR 79 | Ht 68.0 in | Wt 251.0 lb

## 2019-06-24 DIAGNOSIS — M79673 Pain in unspecified foot: Secondary | ICD-10-CM

## 2019-06-24 DIAGNOSIS — M79671 Pain in right foot: Secondary | ICD-10-CM

## 2019-06-24 DIAGNOSIS — G8929 Other chronic pain: Secondary | ICD-10-CM

## 2019-06-24 MED ORDER — GABAPENTIN 100 MG PO CAPS: 200.0000 mg | ORAL_CAPSULE | Freq: Every day | ORAL | 3 refills | Status: DC

## 2019-06-24 NOTE — Progress Notes (Signed)
[facility] [address], [place] [address] Phone: [phone] Subjective:   I Terry Cummings am serving as a Education administrator for Dr. Hulan Cummings.  I'm seeing this patient by the request  of:  Terry Koch, MD   CC: Right heel pain  [id number]  Terry Cummings is a 58 y.o. male coming in with complaint of right heel pain. No numbness and tingling noted. Patient states that he has not done anything for his pain. Terry Cummings Jon's husband.  Onset- chronic  Location - heel pain  Duration-  Character- sore  Aggravating factors- walking  Reliving factors-  Therapies tried-  Severity- 5/10 at its worse      Past Medical History:  Diagnosis Date   Atypical nevus 07/26/2003   Upper Center Back-Slight to Moderate (w/s)   Backache, unspecified    Hypertension    Obesity, unspecified    Other and unspecified hyperlipidemia    Pain in joint, lower leg    Personal history of urinary calculi    Past Surgical History:  Procedure Laterality Date   excision of prepatellar burs     IRRIGATION AND DEBRIDEMENT, open, of infected prepatellar bursa     TONSILLECTOMY     Social History   Socioeconomic History   Marital status: Married    Spouse name: Not on file   Number of children: Not on file   Years of education: Not on file   Highest education level: Not on file  Occupational History   Occupation: welder  Scientist, product/process development strain: Not on file   Food insecurity    Worry: Not on file    Inability: Not on file   Transportation needs    Medical: Not on file    Non-medical: Not on file  Tobacco Use   Smoking status: Never Smoker   Smokeless tobacco: Current User    Types: Chew  Substance and Sexual Activity   Alcohol use: No   Drug use: No   Sexual activity: Not on file  Lifestyle   Physical activity    Days per week: Not on file    Minutes per session: Not on file   Stress: Not on file    Relationships   Social connections    Talks on phone: Not on file    Gets together: Not on file    Attends religious service: Not on file    Active member of club or organization: Not on file    Attends meetings of clubs or organizations: Not on file    Relationship status: Not on file  Other Topics Concern   Not on file  Social History Narrative   Not on file   Allergies  Allergen Reactions   Codeine     REACTION: nausea   Neomycin-Bacitracin Zn-Polymyx     REACTION: rash/itching   Family History  Problem Relation Age of Onset   Alcohol abuse Father    Cirrhosis Father    Brain cancer Father      Current Outpatient Medications (Cardiovascular):    lisinopril-hydrochlorothiazide (PRINZIDE,ZESTORETIC) 20-12.5 MG tablet, TAKE 1 TABLET BY MOUTH  DAILY   pravastatin (PRAVACHOL) 20 MG tablet, TAKE 1 TABLET BY MOUTH  DAILY   Current Outpatient Medications (Analgesics):    allopurinol (ZYLOPRIM) 300 MG tablet, Take 1 tablet (300 mg total) by mouth daily.   aspirin 81 MG tablet, Take 81 mg by mouth daily.   Colchicine (MITIGARE) 0.6 MG CAPS,  Take 1 tablet by mouth 2 (two) times daily.   Current Outpatient Medications (Other):    cyclobenzaprine (FLEXERIL) 5 MG tablet, TAKE 1 TABLET BY MOUTH THREE TIMES A DAY AS NEEDED FOR MUSCLE SPASMS   triamcinolone cream (KENALOG) 0.1 %, Apply 1 application topically 2 (two) times daily.   gabapentin (NEURONTIN) 100 MG capsule, Take 2 capsules (200 mg total) by mouth at bedtime.    Past medical history, social, surgical and family history all reviewed in electronic medical record.  No pertanent information unless stated regarding to the chief complaint.   Review of Systems:  No headache, visual changes, nausea, vomiting, diarrhea, constipation, dizziness, abdominal pain, skin rash, fevers, chills, night sweats, weight loss, swollen lymph nodes, body aches, joint swelling, chest pain, shortness of breath, mood changes.   Positive muscle aches  Objective  Blood pressure (!) 126/92, pulse 79, height 5\' 8"  (1.727 m), weight 251 lb (113.9 kg), SpO2 (!) 84 %.    General: No apparent distress alert and oriented x3 mood and affect normal, dressed appropriately.  HEENT: Pupils equal, extraocular movements intact  Respiratory: Patient's speak in full sentences and does not appear short of breath  Cardiovascular: No lower extremity edema, non tender, no erythema  Skin: Warm dry intact with no signs of infection or rash on extremities or on axial skeleton.  Abdomen: Soft nontender  Neuro: Cranial nerves II through XII are intact, neurovascularly intact in all extremities with 2+ DTRs and 2+ pulses.  Lymph: No lymphadenopathy of posterior or anterior cervical chain or axillae bilaterally.  Gait normal with good balance and coordination.  MSK:  Non tender with full range of motion and good stability and symmetric strength and tone of shoulders, elbows, wrist, hip, knee bilaterally.  Patient does have more pes planus noted.  Patient states that it is somewhat tender on the plantar aspect of the calcaneal region.  No pain over the medial aspect.  No tightness of the posterior cord.  Mild breakdown the longitudinal and transverse arch.  Limited musculoskeletal ultrasound was performed and interpreted by Lyndal Pulley  Limited ultrasound of patient's heel area shows the patient's plantar fascia is within normal limits.  Patient may have a cortical irregularity of the calcaneal region that could be secondary to a stress reaction   Impression and Recommendations:     This case required medical decision making of moderate complexity. The above documentation has been reviewed and is accurate and complete Lyndal Pulley, DO       Note: This dictation was prepared with Dragon dictation along with smaller phrase technology. Any transcriptional errors that result from this process are unintentional.

## 2019-06-24 NOTE — Assessment & Plan Note (Signed)
Heel pain noted.  Possible calcaneal stress reaction, discussed a heel doughnut, vitamin D, icing regimen, mild exercises for the longitudinal arch given, proper shoes, avoid being barefoot, follow-up again in 4 weeks

## 2019-06-24 NOTE — Patient Instructions (Signed)
4,000-5,000 vitamin D Exercise 3 times a week Ice 20 mins 2 times a day Heel doughnut with a lot of walking Gabapentin 200 mg at night Follow up in 4 weeks

## 2019-07-27 ENCOUNTER — Ambulatory Visit: Payer: 59 | Admitting: Internal Medicine

## 2019-07-27 ENCOUNTER — Ambulatory Visit: Payer: 59 | Admitting: Family Medicine

## 2019-07-27 ENCOUNTER — Other Ambulatory Visit: Payer: Self-pay

## 2019-07-27 ENCOUNTER — Encounter: Payer: Self-pay | Admitting: Family Medicine

## 2019-07-27 DIAGNOSIS — M79673 Pain in unspecified foot: Secondary | ICD-10-CM | POA: Diagnosis not present

## 2019-07-27 NOTE — Assessment & Plan Note (Signed)
On exam today patient is making some progress.  We discussed the possibility of formal physical therapy which patient declined.  We discussed continuing of the heel lift, icing regimen, which activities to do which was to avoid.  Discussed topical anti-inflammatories.  Follow-up again in 4 to 8 weeks

## 2019-07-27 NOTE — Progress Notes (Signed)
[facility] [place], Terry Cummings [address] Phone: [phone] Subjective:   I Terry Cummings am serving as a Education administrator for Dr. Alroy Dust Garrie Cummings.;ing  I'm seeing this patient by the request  of:    CC: Right heel pain follow-up  [id number]   06/24/2019 Heel pain noted.  Possible calcaneal stress reaction, discussed a heel doughnut, vitamin D, icing regimen, mild exercises for the longitudinal arch given, proper shoes, avoid being barefoot, follow-up again in 4 weeks  07/27/2019 Terry Cummings is a 58 y.o. male coming in with complaint of heel pain. States that he is still in pain.  Would state that he is about 20 to 30% better.  Did get new boots which has helped him some.  Patient is taking the allopurinol on a regular basis and states that the swelling is better but still has tenderness in the heel usually at night.     Past Medical History:  Diagnosis Date  . Atypical nevus 07/26/2003   Upper Center Back-Slight to Moderate (w/s)  . Backache, unspecified   . Hypertension   . Obesity, unspecified   . Other and unspecified hyperlipidemia   . Pain in joint, lower leg   . Personal history of urinary calculi    Past Surgical History:  Procedure Laterality Date  . excision of prepatellar burs    . IRRIGATION AND DEBRIDEMENT, open, of infected prepatellar bursa    . TONSILLECTOMY     Social History   Socioeconomic History  . Marital status: Married    Spouse name: Not on file  . Number of children: Not on file  . Years of education: Not on file  . Highest education level: Not on file  Occupational History  . Occupation: welder  Social Needs  . Financial resource strain: Not on file  . Food insecurity    Worry: Not on file    Inability: Not on file  . Transportation needs    Medical: Not on file    Non-medical: Not on file  Tobacco Use  . Smoking status: Never Smoker  . Smokeless tobacco: Current User    Types: Chew  Substance and  Sexual Activity  . Alcohol use: No  . Drug use: No  . Sexual activity: Not on file  Lifestyle  . Physical activity    Days per week: Not on file    Minutes per session: Not on file  . Stress: Not on file  Relationships  . Social Herbalist on phone: Not on file    Gets together: Not on file    Attends religious service: Not on file    Active member of club or organization: Not on file    Attends meetings of clubs or organizations: Not on file    Relationship status: Not on file  Other Topics Concern  . Not on file  Social History Narrative  . Not on file   Allergies  Allergen Reactions  . Codeine     REACTION: nausea  . Neomycin-Bacitracin Zn-Polymyx     REACTION: rash/itching   Family History  Problem Relation Age of Onset  . Alcohol abuse Father   . Cirrhosis Father   . Brain cancer Father      Current Outpatient Medications (Cardiovascular):  .  lisinopril-hydrochlorothiazide (PRINZIDE,ZESTORETIC) 20-12.5 MG tablet, TAKE 1 TABLET BY MOUTH  DAILY .  pravastatin (PRAVACHOL) 20 MG tablet, TAKE 1 TABLET BY MOUTH  DAILY   Current Outpatient  Medications (Analgesics):  .  allopurinol (ZYLOPRIM) 300 MG tablet, Take 1 tablet (300 mg total) by mouth daily. Marland Kitchen  aspirin 81 MG tablet, Take 81 mg by mouth daily. .  Colchicine (MITIGARE) 0.6 MG CAPS, Take 1 tablet by mouth 2 (two) times daily.   Current Outpatient Medications (Other):  .  cyclobenzaprine (FLEXERIL) 5 MG tablet, TAKE 1 TABLET BY MOUTH THREE TIMES A DAY AS NEEDED FOR MUSCLE SPASMS .  gabapentin (NEURONTIN) 100 MG capsule, Take 2 capsules (200 mg total) by mouth at bedtime. .  triamcinolone cream (KENALOG) 0.1 %, Apply 1 application topically 2 (two) times daily.    Past medical history, social, surgical and family history all reviewed in electronic medical record.  No pertanent information unless stated regarding to the chief complaint.   Review of Systems:  No headache, visual changes, nausea,  vomiting, diarrhea, constipation, dizziness, abdominal pain, skin rash, fevers, chills, night sweats, weight loss, swollen lymph nodes, body aches, joint swelling, muscle aches, chest pain, shortness of breath, mood changes.   Objective  Blood pressure 122/90, pulse 77, height 5\' 8"  (1.727 m), weight 256 lb (116.1 kg), SpO2 97 %. Systems examined below as of    General: No apparent distress alert and oriented x3 mood and affect normal, dressed appropriately.  HEENT: Pupils equal, extraocular movements intact  Respiratory: Patient's speak in full sentences and does not appear short of breath  Cardiovascular: No lower extremity edema, non tender, no erythema  Skin: Warm dry intact with no signs of infection or rash on extremities or on axial skeleton.  Abdomen: Soft nontender  Neuro: Cranial nerves II through XII are intact, neurovascularly intact in all extremities with 2+ DTRs and 2+ pulses.  Lymph: No lymphadenopathy of posterior or anterior cervical chain or axillae bilaterally.  Gait normal with good balance and coordination.  MSK:  Non tender with full range of motion and good stability and symmetric strength and tone of shoulders, elbows, wrist, hip, knee bilaterally.  Ankle shows the patient does have some more of the overpronation of the hindfoot.  Mild tenderness noted over the plantar aspect of the calcaneal region.  Patient does have pain on the longitudinal arch little bit as well.  Mild stiffness can last 2 to 5 degrees in all planes compared to the contralateral side.   Impression and Recommendations:      The above documentation has been reviewed and is accurate and complete Terry Pulley, DO       Note: This dictation was prepared with Dragon dictation along with smaller phrase technology. Any transcriptional errors that result from this process are unintentional.

## 2019-07-27 NOTE — Patient Instructions (Signed)
Continue what you are doing  See me again in 5 weeks

## 2019-07-29 ENCOUNTER — Ambulatory Visit (INDEPENDENT_AMBULATORY_CARE_PROVIDER_SITE_OTHER): Payer: 59 | Admitting: Internal Medicine

## 2019-07-29 ENCOUNTER — Other Ambulatory Visit (INDEPENDENT_AMBULATORY_CARE_PROVIDER_SITE_OTHER): Payer: 59

## 2019-07-29 ENCOUNTER — Encounter: Payer: Self-pay | Admitting: Internal Medicine

## 2019-07-29 ENCOUNTER — Other Ambulatory Visit: Payer: Self-pay

## 2019-07-29 VITALS — BP 122/86 | HR 75 | Temp 98.3°F | Ht 68.0 in | Wt 256.0 lb

## 2019-07-29 DIAGNOSIS — Z0000 Encounter for general adult medical examination without abnormal findings: Secondary | ICD-10-CM

## 2019-07-29 DIAGNOSIS — R7301 Impaired fasting glucose: Secondary | ICD-10-CM

## 2019-07-29 DIAGNOSIS — M1A00X0 Idiopathic chronic gout, unspecified site, without tophus (tophi): Secondary | ICD-10-CM

## 2019-07-29 DIAGNOSIS — E782 Mixed hyperlipidemia: Secondary | ICD-10-CM | POA: Diagnosis not present

## 2019-07-29 DIAGNOSIS — I10 Essential (primary) hypertension: Secondary | ICD-10-CM

## 2019-07-29 LAB — LDL CHOLESTEROL, DIRECT: Direct LDL: 107 mg/dL

## 2019-07-29 LAB — LIPID PANEL
Cholesterol: 171 mg/dL (ref 0–200)
HDL: 32.6 mg/dL — ABNORMAL LOW (ref >39.00)
NonHDL: 138.08
Total CHOL/HDL Ratio: 5
Triglycerides: 213 mg/dL — ABNORMAL HIGH (ref 0.0–149.0)
VLDL: 42.6 mg/dL — ABNORMAL HIGH (ref 0.0–40.0)

## 2019-07-29 LAB — CBC
HCT: 40.5 % (ref 39.0–52.0)
Hemoglobin: 13.3 g/dL (ref 13.0–17.0)
MCHC: 32.9 g/dL (ref 30.0–36.0)
MCV: 89.7 fl (ref 78.0–100.0)
Platelets: 247 10*3/uL (ref 150.0–400.0)
RBC: 4.51 Mil/uL (ref 4.22–5.81)
RDW: 14.4 % (ref 11.5–15.5)
WBC: 7.1 10*3/uL (ref 4.0–10.5)

## 2019-07-29 LAB — COMPREHENSIVE METABOLIC PANEL
ALT: 15 U/L (ref 0–53)
AST: 13 U/L (ref 0–37)
Albumin: 4.1 g/dL (ref 3.5–5.2)
Alkaline Phosphatase: 52 U/L (ref 39–117)
BUN: 22 mg/dL (ref 6–23)
CO2: 30 mEq/L (ref 19–32)
Calcium: 9.6 mg/dL (ref 8.4–10.5)
Chloride: 101 mEq/L (ref 96–112)
Creatinine, Ser: 1.55 mg/dL — ABNORMAL HIGH (ref 0.40–1.50)
GFR: 45.89 mL/min — ABNORMAL LOW (ref >60.00)
Glucose, Bld: 97 mg/dL (ref 70–99)
Potassium: 4 mEq/L (ref 3.5–5.1)
Sodium: 137 mEq/L (ref 135–145)
Total Bilirubin: 0.4 mg/dL (ref 0.2–1.2)
Total Protein: 7.2 g/dL (ref 6.0–8.3)

## 2019-07-29 LAB — URIC ACID: Uric Acid, Serum: 5 mg/dL (ref 4.0–7.8)

## 2019-07-29 LAB — HEMOGLOBIN A1C: Hgb A1c MFr Bld: 6.4 % (ref 4.6–6.5)

## 2019-07-29 NOTE — Assessment & Plan Note (Signed)
Taking allopurinol 300 mg daily and last uric acid at goal. Checking today and adjust as needed.

## 2019-07-29 NOTE — Assessment & Plan Note (Signed)
Checking HgA1c and adjust as needed.  

## 2019-07-29 NOTE — Assessment & Plan Note (Signed)
BP at goal. Checking CMP and adjust lisinopril/hctz as needed.

## 2019-07-29 NOTE — Progress Notes (Signed)
   Subjective:   Patient ID: Terry Cummings, male    DOB: [birth year], 58 y.o.   MRN: [id number]  HPI The patient is a 58 YO man coming in for physical.  PMH, [place], social history reviewed and updated  Review of Systems  Constitutional: Negative.   HENT: Negative.   Eyes: Negative.   Respiratory: Negative for cough, chest tightness and shortness of breath.   Cardiovascular: Negative for chest pain, palpitations and leg swelling.  Gastrointestinal: Negative for abdominal distention, abdominal pain, constipation, diarrhea, nausea and vomiting.  Musculoskeletal: Negative.   Skin: Negative.   Neurological: Negative.   Psychiatric/Behavioral: Negative.     Objective:  Physical Exam Constitutional:      Appearance: He is well-developed. He is obese.  HENT:     Head: Normocephalic and atraumatic.  Neck:     Musculoskeletal: Normal range of motion.  Cardiovascular:     Rate and Rhythm: Normal rate and regular rhythm.  Pulmonary:     Effort: Pulmonary effort is normal. No respiratory distress.     Breath sounds: Normal breath sounds. No wheezing or rales.  Abdominal:     General: Bowel sounds are normal. There is no distension.     Palpations: Abdomen is soft.     Tenderness: There is no abdominal tenderness. There is no rebound.  Skin:    General: Skin is warm and dry.  Neurological:     Mental Status: He is alert and oriented to person, place, and time.     Coordination: Coordination normal.     Vitals:   07/29/19 1429  BP: 122/86  Pulse: 75  Temp: 98.3 F (36.8 C)  TempSrc: Oral  SpO2: 99%  Weight: 256 lb (116.1 kg)  Height: 5\' 8"  (1.727 m)    Assessment & Plan:

## 2019-07-29 NOTE — Assessment & Plan Note (Signed)
Flu shot up to date. Shingrix complete. Tetanus up to date. Colonoscopy up to date. Counseled about sun safety and mole surveillance. Counseled about the dangers of distracted driving. Given 10 year screening recommendations.  

## 2019-07-29 NOTE — Patient Instructions (Signed)

## 2019-07-29 NOTE — Assessment & Plan Note (Signed)
Complicated by gout, hyperlipidemia, essential hypertension. Does not feel able to work on weight loss right now.

## 2019-07-29 NOTE — Assessment & Plan Note (Signed)
Taking pravastatin and checking lipid panel and adjust as needed.

## 2019-08-03 ENCOUNTER — Telehealth: Payer: Self-pay

## 2019-08-03 MED ORDER — PREDNISONE 20 MG PO TABS: 40.0000 mg | ORAL_TABLET | Freq: Every day | ORAL | 0 refills | Status: DC

## 2019-08-03 NOTE — Telephone Encounter (Signed)
Sent in prednisone 2 pills daily for 5 days which should help. Keep taking allopurinol daily.

## 2019-08-03 NOTE — Telephone Encounter (Signed)
LVM informing of MD response

## 2019-08-03 NOTE — Telephone Encounter (Signed)
Patient wants to know if something can be called in for his gout without making an appointment. Was last seen 07/29/2019

## 2019-08-03 NOTE — Addendum Note (Signed)
Addended by: Pricilla Holm A on: 08/03/2019 10:14 AM   Modules accepted: Orders

## 2019-08-03 NOTE — Telephone Encounter (Signed)
Copied from [place] [id number]. Topic: General - Inquiry >> Aug 02, 2019  3:37 PM Percell Belt A wrote: Reason for CRM: pt called in and stated he has a huge gout flare up on his right foot and would like to know if Dr would call something in with him having an appt?   [facility] [address] number [phone]

## 2019-08-31 ENCOUNTER — Encounter: Payer: Self-pay | Admitting: Family Medicine

## 2019-08-31 ENCOUNTER — Ambulatory Visit: Payer: 59 | Admitting: Family Medicine

## 2019-08-31 ENCOUNTER — Other Ambulatory Visit: Payer: Self-pay

## 2019-08-31 VITALS — BP 128/88 | HR 82 | Ht 68.0 in | Wt 257.0 lb

## 2019-08-31 DIAGNOSIS — M79673 Pain in unspecified foot: Secondary | ICD-10-CM | POA: Diagnosis not present

## 2019-08-31 DIAGNOSIS — M79671 Pain in right foot: Secondary | ICD-10-CM

## 2019-08-31 DIAGNOSIS — M2142 Flat foot [pes planus] (acquired), left foot: Secondary | ICD-10-CM

## 2019-08-31 DIAGNOSIS — M2141 Flat foot [pes planus] (acquired), right foot: Secondary | ICD-10-CM

## 2019-08-31 DIAGNOSIS — M2140 Flat foot [pes planus] (acquired), unspecified foot: Secondary | ICD-10-CM | POA: Insufficient documentation

## 2019-08-31 NOTE — Progress Notes (Signed)
[name] [place], [place] [address] Phone: [phone] Subjective:   I [name] am serving as a Education administrator for Dr. Hulan Saas.  This visit occurred during the SARS-CoV-2 public health emergency.  Safety protocols were in place, including screening questions prior to the visit, additional usage of staff PPE, and extensive cleaning of exam room while observing appropriate contact time as indicated for disinfecting solutions.     CC: Foot pain now bilateral  [id number]   07/27/2019 On exam today patient is making some progress.  We discussed the possibility of formal physical therapy which patient declined.  We discussed continuing of the heel lift, icing regimen, which activities to do which was to avoid.  Discussed topical anti-inflammatories.  Follow-up again in 4 to 8 weeks  08/31/2019 EDD ROWDEN is a 58 y.o. male coming in with complaint of right heel pain. Right heel is still painful and the pain is dependant on how much he walks or stands. States he has a knot on his left foot.   Onset- Chronic  Location - bottom of the left foot  Duration-  Character- sore  Aggravating factors- walking Reliving factors-  Therapies tried-  Severity-8 out of 10.  Feels like it is on both sides of the feet     Past Medical History:  Diagnosis Date  . Atypical nevus 07/26/2003   Upper Center Back-Slight to Moderate (w/s)  . Backache, unspecified   . Hypertension   . Obesity, unspecified   . Other and unspecified hyperlipidemia   . Pain in joint, lower leg   . Personal history of urinary calculi    Past Surgical History:  Procedure Laterality Date  . excision of prepatellar burs    . IRRIGATION AND DEBRIDEMENT, open, of infected prepatellar bursa    . TONSILLECTOMY     Social History   Socioeconomic History  . Marital status: Married    Spouse name: Not on file  . Number of children: Not on file  . Years of education: Not  on file  . Highest education level: Not on file  Occupational History  . Occupation: welder  Social Needs  . Financial resource strain: Not on file  . Food insecurity    Worry: Not on file    Inability: Not on file  . Transportation needs    Medical: Not on file    Non-medical: Not on file  Tobacco Use  . Smoking status: Never Smoker  . Smokeless tobacco: Current User    Types: Chew  Substance and Sexual Activity  . Alcohol use: No  . Drug use: No  . Sexual activity: Not on file  Lifestyle  . Physical activity    Days per week: Not on file    Minutes per session: Not on file  . Stress: Not on file  Relationships  . Social Herbalist on phone: Not on file    Gets together: Not on file    Attends religious service: Not on file    Active member of club or organization: Not on file    Attends meetings of clubs or organizations: Not on file    Relationship status: Not on file  Other Topics Concern  . Not on file  Social History Narrative  . Not on file   Allergies  Allergen Reactions  . Codeine     REACTION: nausea  . Neomycin-Bacitracin Zn-Polymyx     REACTION: rash/itching  Family History  Problem Relation Age of Onset  . Alcohol abuse Father   . Cirrhosis Father   . Brain cancer Father     Current Outpatient Medications (Endocrine & Metabolic):  .  predniSONE (DELTASONE) 20 MG tablet, Take 2 tablets (40 mg total) by mouth daily with breakfast.  Current Outpatient Medications (Cardiovascular):  .  lisinopril-hydrochlorothiazide (PRINZIDE,ZESTORETIC) 20-12.5 MG tablet, TAKE 1 TABLET BY MOUTH  DAILY .  pravastatin (PRAVACHOL) 20 MG tablet, TAKE 1 TABLET BY MOUTH  DAILY   Current Outpatient Medications (Analgesics):  .  allopurinol (ZYLOPRIM) 300 MG tablet, Take 1 tablet (300 mg total) by mouth daily. Marland Kitchen  aspirin 81 MG tablet, Take 81 mg by mouth daily. .  Colchicine (MITIGARE) 0.6 MG CAPS, Take 1 tablet by mouth 2 (two) times daily.   Current  Outpatient Medications (Other):  .  cyclobenzaprine (FLEXERIL) 5 MG tablet, TAKE 1 TABLET BY MOUTH THREE TIMES A DAY AS NEEDED FOR MUSCLE SPASMS .  gabapentin (NEURONTIN) 100 MG capsule, Take 2 capsules (200 mg total) by mouth at bedtime. .  triamcinolone cream (KENALOG) 0.1 %, Apply 1 application topically 2 (two) times daily.    Past medical history, social, surgical and family history all reviewed in electronic medical record.  No pertanent information unless stated regarding to the chief complaint.   Review of Systems:  No headache, visual changes, nausea, vomiting, diarrhea, constipation, dizziness, abdominal pain, skin rash, fevers, chills, night sweats, weight loss, swollen lymph nodes, body aches, joint swelling, muscle aches, chest pain, shortness of breath, mood changes.   Objective  Blood pressure 116/80, pulse 92, height 5\' 8"  (1.727 m), weight 257 lb (116.6 kg), SpO2 94 %.    General: No apparent distress alert and oriented x3 mood and affect normal, dressed appropriately.  HEENT: Pupils equal, extraocular movements intact  Respiratory: Patient's speak in full sentences and does not appear short of breath  Cardiovascular: No lower extremity edema, non tender, no erythema  Skin: Warm dry intact with no signs of infection or rash on extremities or on axial skeleton.  Abdomen: Soft nontender  Neuro: Cranial nerves II through XII are intact, neurovascularly intact in all extremities with 2+ DTRs and 2+ pulses.  Lymph: No lymphadenopathy of posterior or anterior cervical chain or axillae bilaterally.  Gait normal with good balance and coordination.  MSK:  Non tender with full range of motion and good stability and symmetric strength and tone of shoulders, elbows, wrist, hip, knee and bilaterally.  Mild arthritic changes of multiple joints   Bilateral foot exam shows pes planus noted.  Breakdown of the transverse arch left greater than right.  Patient does have metatarsalgia on the  plantar aspect across 2 through 4.  Patient has splaying between the second and third toe on the left foot.  Right foot does have bunion and bunionette formation noted.  Mild overpronation of the hindfoot bilaterally Impression and Recommendations:     T. The above documentation has been reviewed and is accurate and complete Lyndal Pulley, DO       Note: This dictation was prepared with Dragon dictation along with smaller phrase technology. Any transcriptional errors that result from this process are unintentional.

## 2019-08-31 NOTE — Assessment & Plan Note (Signed)
Patient does have pes planus bilaterally.  Patient does have breakdown of the transverse arch.  Due to patient's working being on his feet I do feel that custom orthotics could be beneficial.  Patient is having pain of the heels as well as in the forefoot.  We will refer him today.  Discussed 9 continue the treatment for the gout as the other possible cause.  Worsening symptoms will need to consider possible injections.

## 2019-08-31 NOTE — Patient Instructions (Addendum)
[place] hiking boots See me again in 5-6 weeks

## 2019-08-31 NOTE — Progress Notes (Signed)
Terry Cummings - 58 y.o. male MRN [id number]  Date of birth: 03-Jan-1961  SUBJECTIVE:  Including CC & ROS.  Chief Complaint  Patient presents with  . Foot Orthotics    Terry Cummings is a 58 y.o. male that is presenting with left plantar forefoot pain and right heel pain.  The pain is acute on chronic in nature.  He walks for long periods of time as well as stands.  The pain is worse with prolonged standing or walking.  He works as a Building control surveyor.  The pain is intermittent in nature.  Pain is sharp and stabbing.  Pain can be moderate to severe.  Denies any specific inciting event.  Has tried over-the-counter insoles in the past.   Review of Systems  Constitutional: Negative for fever.  HENT: Negative for congestion.   Respiratory: Negative for cough.   Cardiovascular: Negative for chest pain.  Gastrointestinal: Negative for abdominal pain.  Musculoskeletal: Positive for gait problem.  Skin: Negative for color change.  Neurological: Negative for weakness.  Hematological: Negative for adenopathy.    HISTORY: Past Medical, Surgical, Social, and Family History Reviewed & Updated per EMR.   Pertinent Historical Findings include:  Past Medical History:  Diagnosis Date  . Atypical nevus 07/26/2003   Upper Center Back-Slight to Moderate (w/s)  . Backache, unspecified   . Hypertension   . Obesity, unspecified   . Other and unspecified hyperlipidemia   . Pain in joint, lower leg   . Personal history of urinary calculi     Past Surgical History:  Procedure Laterality Date  . excision of prepatellar burs    . IRRIGATION AND DEBRIDEMENT, open, of infected prepatellar bursa    . TONSILLECTOMY      Allergies  Allergen Reactions  . Codeine     REACTION: nausea  . Neomycin-Bacitracin Zn-Polymyx     REACTION: rash/itching    Family History  Problem Relation Age of Onset  . Alcohol abuse Father   . Cirrhosis Father   . Brain cancer Father      Social History   Socioeconomic  History  . Marital status: Married    Spouse name: Not on file  . Number of children: Not on file  . Years of education: Not on file  . Highest education level: Not on file  Occupational History  . Occupation: welder  Social Needs  . Financial resource strain: Not on file  . Food insecurity    Worry: Not on file    Inability: Not on file  . Transportation needs    Medical: Not on file    Non-medical: Not on file  Tobacco Use  . Smoking status: Never Smoker  . Smokeless tobacco: Current User    Types: Chew  Substance and Sexual Activity  . Alcohol use: No  . Drug use: No  . Sexual activity: Not on file  Lifestyle  . Physical activity    Days per week: Not on file    Minutes per session: Not on file  . Stress: Not on file  Relationships  . Social Herbalist on phone: Not on file    Gets together: Not on file    Attends religious service: Not on file    Active member of club or organization: Not on file    Attends meetings of clubs or organizations: Not on file    Relationship status: Not on file  . Intimate partner violence    Fear of current or  ex partner: Not on file    Emotionally abused: Not on file    Physically abused: Not on file    Forced sexual activity: Not on file  Other Topics Concern  . Not on file  Social History Narrative  . Not on file     PHYSICAL EXAM:  VS: BP 128/88   Pulse 82   Ht 5\' 8"  (1.727 m)   Wt 257 lb (116.6 kg)   BMI 39.08 kg/m  Physical Exam Gen: NAD, alert, cooperative with exam, well-appearing ENT: normal lips, normal nasal mucosa,  Eye: normal EOM, normal conjunctiva and lids CV:  no edema, +2 pedal pulses   Resp: no accessory muscle use, non-labored,  Skin: no rashes, no areas of induration  Neuro: normal tone, normal sensation to touch Psych:  normal insight, alert and oriented MSK:  Right left foot: Plus planus Hallux valgus. Tenderness to palpation over the interdigit webspace between the second and third  on the left. Tenderness palpation at the heel on the right. Neurovascularly intact  Patient was fitted for a standard, cushioned, semi-rigid orthotic. The orthotic was heated and afterward the patient stood on the orthotic blank positioned on the orthotic stand. The patient was positioned in subtalar neutral position and 10 degrees of ankle dorsiflexion in a weight bearing stance. After completion of molding, a stable base was applied to the orthotic blank. The blank was ground to a stable position for weight bearing. Size: 11 Base: Blue Health and safety inspector and Padding: First ray post placed bilaterally, small metatarsal pad placed on the left act as a neuroma pad The patient ambulated these, and they were very comfortable.     ASSESSMENT & PLAN:   Pes planus Tends to walk for long and stand for long periods of time.  His heel pain on the right and his forefoot pain on the left seem to be exacerbated by this.  Has tried over-the-counter insoles and cushioning with limited improvement. -Orthotics today. -Placed first ray post bilaterally. -Could consider neuroma pad on the left.  Currently placed a metatarsal pad.

## 2019-08-31 NOTE — Assessment & Plan Note (Signed)
Heel pain with pes planus.  Patient does have regular transverse arch as well now on the left foot causing more discomfort and pain.  Due to patient's different pains I would encourage patient to get a custom orthotics only for today.  Hopefully this can be done in the near future.  Discussed which shoes, would be beneficial as well.  Discussed icing regimen, home exercises, patient encouraged to still avoid being barefoot, discussed weight loss.  Follow-up with me again in 4 to 6 weeks

## 2019-08-31 NOTE — Assessment & Plan Note (Signed)
Tends to walk for long and stand for long periods of time.  His heel pain on the right and his forefoot pain on the left seem to be exacerbated by this.  Has tried over-the-counter insoles and cushioning with limited improvement. -Orthotics today. -Placed first ray post bilaterally. -Could consider neuroma pad on the left.  Currently placed a metatarsal pad.

## 2019-09-06 ENCOUNTER — Other Ambulatory Visit: Payer: Self-pay | Admitting: Internal Medicine

## 2019-09-22 ENCOUNTER — Other Ambulatory Visit: Payer: Self-pay | Admitting: Family

## 2019-09-26 ENCOUNTER — Other Ambulatory Visit: Payer: Self-pay | Admitting: Internal Medicine

## 2019-10-05 ENCOUNTER — Other Ambulatory Visit: Payer: Self-pay | Admitting: Internal Medicine

## 2019-10-20 DIAGNOSIS — H2511 Age-related nuclear cataract, right eye: Secondary | ICD-10-CM | POA: Diagnosis not present

## 2019-10-27 ENCOUNTER — Other Ambulatory Visit: Payer: Self-pay | Admitting: Internal Medicine

## 2019-11-01 DIAGNOSIS — H2511 Age-related nuclear cataract, right eye: Secondary | ICD-10-CM | POA: Diagnosis not present

## 2019-11-01 DIAGNOSIS — H25811 Combined forms of age-related cataract, right eye: Secondary | ICD-10-CM | POA: Diagnosis not present

## 2019-11-02 DIAGNOSIS — H2512 Age-related nuclear cataract, left eye: Secondary | ICD-10-CM | POA: Diagnosis not present

## 2019-11-08 DIAGNOSIS — H2512 Age-related nuclear cataract, left eye: Secondary | ICD-10-CM | POA: Diagnosis not present

## 2019-11-08 DIAGNOSIS — H25812 Combined forms of age-related cataract, left eye: Secondary | ICD-10-CM | POA: Diagnosis not present

## 2019-12-08 ENCOUNTER — Telehealth: Payer: Self-pay

## 2019-12-08 MED ORDER — PRAVASTATIN SODIUM 20 MG PO TABS: 20.0000 mg | ORAL_TABLET | Freq: Every day | ORAL | 2 refills | Status: DC

## 2019-12-08 MED ORDER — LISINOPRIL-HYDROCHLOROTHIAZIDE 20-12.5 MG PO TABS: 1.0000 | ORAL_TABLET | Freq: Every day | ORAL | 2 refills | Status: DC

## 2019-12-08 NOTE — Telephone Encounter (Signed)
Reviewed chart pt is up-to-date sent refills to [facility].../lmb  

## 2019-12-08 NOTE — Telephone Encounter (Signed)
1.Medication Requested:  lisinopril-hydrochlorothiazide (ZESTORETIC) 20-12.5 MG tablet  pravastatin (PRAVACHOL) 20 MG tablet  2. Pharmacy (Name, Street, [place]): [facility] [address] [place]    3. On Med List: Yes   4. Last Visit with PCP: 10.22.2020   5. Next visit date with PCP: pending

## 2020-01-03 ENCOUNTER — Ambulatory Visit: Payer: 59

## 2020-01-10 ENCOUNTER — Ambulatory Visit: Payer: Self-pay | Attending: Internal Medicine

## 2020-01-10 DIAGNOSIS — Z23 Encounter for immunization: Secondary | ICD-10-CM

## 2020-01-10 NOTE — Progress Notes (Signed)
   Covid-19 Vaccination Clinic  Name:  Terry Cummings    MRN: [id number] DOB: Jun 28, 1961  01/10/2020  [name] was observed post Covid-19 immunization for 15 minutes without incident. He was provided with Vaccine Information Sheet and instruction to access the V-Safe system.   [name] was instructed to call 911 with any severe reactions post vaccine: Marland Kitchen Difficulty breathing  . Swelling of face and throat  . A fast heartbeat  . A bad rash all over body  . Dizziness and weakness   Immunizations Administered    Name Date Dose VIS Date Route   Pfizer COVID-19 Vaccine 01/10/2020  1:16 PM 0.3 mL 09/17/2019 Intramuscular   Manufacturer: [place]   Lot: [id number]   [place]: [id number]

## 2020-02-02 ENCOUNTER — Ambulatory Visit: Payer: Self-pay | Attending: Internal Medicine

## 2020-02-02 DIAGNOSIS — Z23 Encounter for immunization: Secondary | ICD-10-CM

## 2020-02-02 NOTE — Progress Notes (Signed)
   Covid-19 Vaccination Clinic  Name:  Terry Cummings    MRN: [id number] DOB: [birth year]  02/02/2020  [name] was observed post Covid-19 immunization for 15 minutes without incident. He was provided with Vaccine Information Sheet and instruction to access the V-Safe system.   [name] was instructed to call 911 with any severe reactions post vaccine: Marland Kitchen Difficulty breathing  . Swelling of face and throat  . A fast heartbeat  . A bad rash all over body  . Dizziness and weakness   Immunizations Administered    Name Date Dose VIS Date Route   Pfizer COVID-19 Vaccine 02/02/2020  3:27 PM 0.3 mL 12/01/2018 Intramuscular   Manufacturer: [place]   Lot: [id number]   [place]: [id number]

## 2020-08-17 DIAGNOSIS — M5136 Other intervertebral disc degeneration, lumbar region: Secondary | ICD-10-CM | POA: Diagnosis not present

## 2020-08-17 DIAGNOSIS — M545 Low back pain, unspecified: Secondary | ICD-10-CM | POA: Diagnosis not present

## 2020-09-05 ENCOUNTER — Other Ambulatory Visit: Payer: Self-pay | Admitting: Internal Medicine

## 2020-09-27 ENCOUNTER — Other Ambulatory Visit: Payer: Self-pay

## 2020-09-27 ENCOUNTER — Encounter: Payer: Self-pay | Admitting: Internal Medicine

## 2020-09-27 ENCOUNTER — Ambulatory Visit (INDEPENDENT_AMBULATORY_CARE_PROVIDER_SITE_OTHER): Payer: BC Managed Care – PPO | Admitting: Internal Medicine

## 2020-09-27 VITALS — BP 140/82 | HR 83 | Temp 98.5°F | Ht 68.0 in | Wt 249.0 lb

## 2020-09-27 DIAGNOSIS — E782 Mixed hyperlipidemia: Secondary | ICD-10-CM

## 2020-09-27 DIAGNOSIS — I10 Essential (primary) hypertension: Secondary | ICD-10-CM | POA: Diagnosis not present

## 2020-09-27 DIAGNOSIS — R7301 Impaired fasting glucose: Secondary | ICD-10-CM

## 2020-09-27 DIAGNOSIS — Z0000 Encounter for general adult medical examination without abnormal findings: Secondary | ICD-10-CM | POA: Diagnosis not present

## 2020-09-27 LAB — COMPREHENSIVE METABOLIC PANEL
ALT: 21 U/L (ref 0–53)
AST: 20 U/L (ref 0–37)
Albumin: 4.2 g/dL (ref 3.5–5.2)
Alkaline Phosphatase: 33 U/L — ABNORMAL LOW (ref 39–117)
BUN: 20 mg/dL (ref 6–23)
CO2: 28 mEq/L (ref 19–32)
Calcium: 10.1 mg/dL (ref 8.4–10.5)
Chloride: 104 mEq/L (ref 96–112)
Creatinine, Ser: 1.56 mg/dL — ABNORMAL HIGH (ref 0.40–1.50)
GFR: 47.59 mL/min — ABNORMAL LOW (ref >60.00)
Glucose, Bld: 106 mg/dL — ABNORMAL HIGH (ref 70–99)
Potassium: 4.7 mEq/L (ref 3.5–5.1)
Sodium: 137 mEq/L (ref 135–145)
Total Bilirubin: 0.3 mg/dL (ref 0.2–1.2)
Total Protein: 7.2 g/dL (ref 6.0–8.3)

## 2020-09-27 LAB — LIPID PANEL
Cholesterol: 176 mg/dL (ref 0–200)
HDL: 41.7 mg/dL (ref >39.00)
LDL Cholesterol: 102 mg/dL — ABNORMAL HIGH (ref 0–99)
NonHDL: 134.35
Total CHOL/HDL Ratio: 4
Triglycerides: 164 mg/dL — ABNORMAL HIGH (ref 0.0–149.0)
VLDL: 32.8 mg/dL (ref 0.0–40.0)

## 2020-09-27 LAB — CBC
HCT: 40.1 % (ref 39.0–52.0)
Hemoglobin: 13.2 g/dL (ref 13.0–17.0)
MCHC: 32.9 g/dL (ref 30.0–36.0)
MCV: 91 fl (ref 78.0–100.0)
Platelets: 260 10*3/uL (ref 150.0–400.0)
RBC: 4.4 Mil/uL (ref 4.22–5.81)
RDW: 13.9 % (ref 11.5–15.5)
WBC: 5.8 10*3/uL (ref 4.0–10.5)

## 2020-09-27 LAB — HEMOGLOBIN A1C: Hgb A1c MFr Bld: 6.3 % (ref 4.6–6.5)

## 2020-09-27 NOTE — Assessment & Plan Note (Signed)
Checking HgA1c and adjust as needed.  

## 2020-09-27 NOTE — Progress Notes (Signed)
   Subjective:   Patient ID: Terry Cummings, male    DOB: 06-30-1961, 59 y.o.   MRN: [id number]  HPI The patient is a 59 YO man coming in for physical.   PMH, [place], social history reviewed and updated  Review of Systems  Constitutional: Negative.   HENT: Negative.   Eyes: Negative.   Respiratory: Negative for cough, chest tightness and shortness of breath.   Cardiovascular: Negative for chest pain, palpitations and leg swelling.  Gastrointestinal: Negative for abdominal distention, abdominal pain, constipation, diarrhea, nausea and vomiting.  Musculoskeletal: Negative.   Skin: Negative.   Neurological: Negative.   Psychiatric/Behavioral: Negative.     Objective:  Physical Exam Constitutional:      Appearance: He is well-developed and well-nourished. He is obese.  HENT:     Head: Normocephalic and atraumatic.  Eyes:     Extraocular Movements: EOM normal.  Cardiovascular:     Rate and Rhythm: Normal rate and regular rhythm.  Pulmonary:     Effort: Pulmonary effort is normal. No respiratory distress.     Breath sounds: Normal breath sounds. No wheezing or rales.  Abdominal:     General: Bowel sounds are normal. There is no distension.     Palpations: Abdomen is soft.     Tenderness: There is no abdominal tenderness. There is no rebound.  Musculoskeletal:        General: No edema.     Cervical back: Normal range of motion.  Skin:    General: Skin is warm and dry.  Neurological:     Mental Status: He is alert and oriented to person, place, and time.     Coordination: Coordination normal.  Psychiatric:        Mood and Affect: Mood and affect normal.     Vitals:   09/27/20 0911  BP: 140/82  Pulse: 83  Temp: 98.5 F (36.9 C)  TempSrc: Oral  SpO2: 97%  Weight: 249 lb (112.9 kg)  Height: 5\' 8"  (1.727 m)    This visit occurred during the SARS-CoV-2 public health emergency.  Safety protocols were in place, including screening questions prior to the visit, additional  usage of staff PPE, and extensive cleaning of exam room while observing appropriate contact time as indicated for disinfecting solutions.   Assessment & Plan:

## 2020-09-27 NOTE — Patient Instructions (Signed)

## 2020-09-27 NOTE — Assessment & Plan Note (Addendum)
BMI 37 but complicated by hypertension, impaired sugars, hyperlipidemia. Counseled about diet and exercise.

## 2020-09-27 NOTE — Assessment & Plan Note (Signed)
BP at goal on lisinopril/hctz 20/12.5 mg daily. Checking CMP and adjust as needed.  

## 2020-09-27 NOTE — Assessment & Plan Note (Signed)
Flu shot up to date. Covid-19 2 shots scheduled for booster 10/03/20. Shingrix complete. Tetanus up to date. Colonoscopy due 2022 counseled. Counseled about sun safety and mole surveillance. Counseled about the dangers of distracted driving. Given 10 year screening recommendations.

## 2020-09-27 NOTE — Assessment & Plan Note (Signed)
Checking lipid panel and adjust pravastatin 20 mg daily as needed. 

## 2020-10-03 ENCOUNTER — Encounter: Payer: Self-pay | Admitting: Internal Medicine

## 2020-10-17 DIAGNOSIS — H26493 Other secondary cataract, bilateral: Secondary | ICD-10-CM | POA: Diagnosis not present

## 2020-10-17 DIAGNOSIS — H02834 Dermatochalasis of left upper eyelid: Secondary | ICD-10-CM | POA: Diagnosis not present

## 2020-10-17 DIAGNOSIS — H02831 Dermatochalasis of right upper eyelid: Secondary | ICD-10-CM | POA: Diagnosis not present

## 2021-05-15 ENCOUNTER — Other Ambulatory Visit: Payer: Self-pay | Admitting: Family Medicine

## 2021-05-21 ENCOUNTER — Other Ambulatory Visit: Payer: Self-pay

## 2021-05-21 ENCOUNTER — Encounter: Payer: Self-pay | Admitting: Internal Medicine

## 2021-05-21 ENCOUNTER — Other Ambulatory Visit: Payer: Self-pay | Admitting: Internal Medicine

## 2021-05-21 ENCOUNTER — Ambulatory Visit (INDEPENDENT_AMBULATORY_CARE_PROVIDER_SITE_OTHER): Payer: BC Managed Care – PPO | Admitting: Internal Medicine

## 2021-05-21 VITALS — BP 126/70 | HR 64 | Temp 97.7°F | Resp 18 | Ht 68.0 in | Wt 247.8 lb

## 2021-05-21 DIAGNOSIS — I10 Essential (primary) hypertension: Secondary | ICD-10-CM | POA: Diagnosis not present

## 2021-05-21 DIAGNOSIS — R7301 Impaired fasting glucose: Secondary | ICD-10-CM | POA: Diagnosis not present

## 2021-05-21 DIAGNOSIS — E782 Mixed hyperlipidemia: Secondary | ICD-10-CM | POA: Diagnosis not present

## 2021-05-21 DIAGNOSIS — M1A00X0 Idiopathic chronic gout, unspecified site, without tophus (tophi): Secondary | ICD-10-CM

## 2021-05-21 DIAGNOSIS — Z0000 Encounter for general adult medical examination without abnormal findings: Secondary | ICD-10-CM | POA: Diagnosis not present

## 2021-05-21 LAB — LIPID PANEL
Cholesterol: 169 mg/dL (ref 0–200)
HDL: 38.1 mg/dL — ABNORMAL LOW (ref >39.00)
LDL Cholesterol: 108 mg/dL — ABNORMAL HIGH (ref 0–99)
NonHDL: 130.48
Total CHOL/HDL Ratio: 4
Triglycerides: 111 mg/dL (ref 0.0–149.0)
VLDL: 22.2 mg/dL (ref 0.0–40.0)

## 2021-05-21 LAB — COMPREHENSIVE METABOLIC PANEL
ALT: 13 U/L (ref 0–53)
AST: 14 U/L (ref 0–37)
Albumin: 4 g/dL (ref 3.5–5.2)
Alkaline Phosphatase: 44 U/L (ref 39–117)
BUN: 28 mg/dL — ABNORMAL HIGH (ref 6–23)
CO2: 28 mEq/L (ref 19–32)
Calcium: 9.7 mg/dL (ref 8.4–10.5)
Chloride: 104 mEq/L (ref 96–112)
Creatinine, Ser: 1.68 mg/dL — ABNORMAL HIGH (ref 0.40–1.50)
GFR: 43.34 mL/min — ABNORMAL LOW (ref >60.00)
Glucose, Bld: 96 mg/dL (ref 70–99)
Potassium: 4.5 mEq/L (ref 3.5–5.1)
Sodium: 140 mEq/L (ref 135–145)
Total Bilirubin: 0.3 mg/dL (ref 0.2–1.2)
Total Protein: 6.9 g/dL (ref 6.0–8.3)

## 2021-05-21 LAB — HEMOGLOBIN A1C: Hgb A1c MFr Bld: 6.2 % (ref 4.6–6.5)

## 2021-05-21 LAB — CBC
HCT: 39.4 % (ref 39.0–52.0)
Hemoglobin: 13.1 g/dL (ref 13.0–17.0)
MCHC: 33.2 g/dL (ref 30.0–36.0)
MCV: 88 fl (ref 78.0–100.0)
Platelets: 240 10*3/uL (ref 150.0–400.0)
RBC: 4.48 Mil/uL (ref 4.22–5.81)
RDW: 14.1 % (ref 11.5–15.5)
WBC: 7.1 10*3/uL (ref 4.0–10.5)

## 2021-05-21 MED ORDER — PREDNISONE 20 MG PO TABS
40.0000 mg | ORAL_TABLET | Freq: Every day | ORAL | 0 refills | Status: DC
Start: 2021-05-21 — End: 2021-07-31

## 2021-05-21 NOTE — Assessment & Plan Note (Signed)
BP at goal on lisinopril/hctz 20/12.5 mg daily. Checking CMP and adjust as needed.  

## 2021-05-21 NOTE — Progress Notes (Signed)
   Subjective:   Patient ID: Terry Cummings, male    DOB: Aug 21, 1961, 60 y.o.   MRN: [id number]  HPI The patient is a 60 YO man coming in for physical.  Also needs acute for gout which started last week. He called pharmacy to try to get refill on gout medicine but mistakenly told them gabapentin and that refill was declined by sports medicine. He is still in pain left foot. Was on vacation when this started.   PMH, [facility], [place], social history reviewed and updated  Review of Systems  Constitutional: Negative.   HENT: Negative.    Eyes: Negative.   Respiratory:  Negative for cough, chest tightness and shortness of breath.   Cardiovascular:  Negative for chest pain, palpitations and leg swelling.  Gastrointestinal:  Negative for abdominal distention, abdominal pain, constipation, diarrhea, nausea and vomiting.  Musculoskeletal:  Positive for arthralgias and myalgias.  Skin: Negative.   Neurological: Negative.   Psychiatric/Behavioral: Negative.     Objective:  Physical Exam Constitutional:      Appearance: He is well-developed. He is obese.  HENT:     Head: Normocephalic and atraumatic.  Cardiovascular:     Rate and Rhythm: Normal rate and regular rhythm.  Pulmonary:     Effort: Pulmonary effort is normal. No respiratory distress.     Breath sounds: Normal breath sounds. No wheezing or rales.  Abdominal:     General: Bowel sounds are normal. There is no distension.     Palpations: Abdomen is soft.     Tenderness: There is no abdominal tenderness. There is no rebound.  Musculoskeletal:        General: Tenderness present.     Cervical back: Normal range of motion.  Skin:    General: Skin is warm and dry.  Neurological:     Mental Status: He is alert and oriented to person, place, and time.     Coordination: Coordination normal.    Vitals:   05/21/21 0927  BP: 126/70  Pulse: 64  Resp: 18  Temp: 97.7 F (36.5 C)  TempSrc: Oral  SpO2: 98%  Weight: 247 lb 12.8 oz (112.4 kg)   Height: '5\' 8"'$  (1.727 m)    This visit occurred during the SARS-CoV-2 public health emergency.  Safety protocols were in place, including screening questions prior to the visit, additional usage of staff PPE, and extensive cleaning of exam room while observing appropriate contact time as indicated for disinfecting solutions.   Assessment & Plan:

## 2021-05-21 NOTE — Assessment & Plan Note (Signed)
Checking lipid panel and adjust pravastatin 20 mg daily as needed. 

## 2021-05-21 NOTE — Assessment & Plan Note (Signed)
BMI 37 and complicated by gout and hypertension and hyperlipidemia. He is working on diet to lose weight.

## 2021-05-21 NOTE — Assessment & Plan Note (Signed)
Previously taking allopurinol and he stopped. No flare in several years and off allopurinol since 2020. Rx prednisone. Will not check uric acid as in flare today. Given infrequency of flares will not restart allopurinol today.

## 2021-05-21 NOTE — Assessment & Plan Note (Signed)
Checking HgA1c and adjust as needed.  

## 2021-05-21 NOTE — Patient Instructions (Addendum)
We have sent in prednisone for the gout to take 2 pills a day for 5 days to clear the gout.  Call [facility] to do the colonoscopy.

## 2021-05-21 NOTE — Assessment & Plan Note (Signed)
Flu shot yearly. Covid-19 counseled about boosters. Shingrix complete. Tetanus up to date. Colonoscopy due this year and advised to call and schedule. Counseled about sun safety and mole surveillance. Counseled about the dangers of distracted driving. Given 10 year screening recommendations.

## 2021-07-30 ENCOUNTER — Telehealth: Payer: Self-pay | Admitting: Internal Medicine

## 2021-07-30 DIAGNOSIS — M109 Gout, unspecified: Secondary | ICD-10-CM

## 2021-07-30 NOTE — Telephone Encounter (Signed)
Patient calling to request a refill for gout medication  Advised patient I did not see medication on current med list  Patient did not know spelling of medication  Offered patient an appt patient declined  Please advise

## 2021-07-30 NOTE — Telephone Encounter (Signed)
Does he need another round of prednisone? Please advise

## 2021-07-30 NOTE — Telephone Encounter (Signed)
We need more information about this. Is he having a current gout flare up? Did prednisone help prior gout flare up? I am assuming prednisone is medication requested but if not let me know medication requested.

## 2021-07-31 MED ORDER — PREDNISONE 20 MG PO TABS: 40.0000 mg | ORAL_TABLET | Freq: Every day | ORAL | 0 refills | Status: DC

## 2021-07-31 NOTE — Telephone Encounter (Signed)
Have sent in prednisone and he needs to take, then come back to lab for uric acid level about 2-3 weeks after finishing so we can see if he needs preventative medicine to help avoid flare.

## 2021-07-31 NOTE — Telephone Encounter (Signed)
Pt. Has called again and is requesting refill on predniSONE (DELTASONE) 20 MG tablet. States that the medication did help with prior gout flare up. States he is currently having a flare up, and that he has extreme pain in feet.    [id number] - [place], [place]. Phone:  [phone]  Fax:  [phone]       Callback #- [id number]

## 2021-07-31 NOTE — Telephone Encounter (Signed)
See below

## 2021-08-15 ENCOUNTER — Ambulatory Visit: Payer: Self-pay

## 2021-08-15 ENCOUNTER — Other Ambulatory Visit: Payer: Self-pay

## 2021-08-15 ENCOUNTER — Ambulatory Visit (INDEPENDENT_AMBULATORY_CARE_PROVIDER_SITE_OTHER): Payer: BC Managed Care – PPO

## 2021-08-15 ENCOUNTER — Encounter: Payer: Self-pay | Admitting: Family Medicine

## 2021-08-15 ENCOUNTER — Ambulatory Visit: Payer: BC Managed Care – PPO | Admitting: Family Medicine

## 2021-08-15 VITALS — BP 112/76 | HR 90 | Ht 68.0 in | Wt 228.0 lb

## 2021-08-15 DIAGNOSIS — S92912A Unspecified fracture of left toe(s), initial encounter for closed fracture: Secondary | ICD-10-CM | POA: Insufficient documentation

## 2021-08-15 DIAGNOSIS — M79672 Pain in left foot: Secondary | ICD-10-CM

## 2021-08-15 DIAGNOSIS — R600 Localized edema: Secondary | ICD-10-CM | POA: Diagnosis not present

## 2021-08-15 DIAGNOSIS — M1A00X0 Idiopathic chronic gout, unspecified site, without tophus (tophi): Secondary | ICD-10-CM

## 2021-08-15 MED ORDER — ALLOPURINOL 100 MG PO TABS: 200.0000 mg | ORAL_TABLET | Freq: Every day | ORAL | 1 refills | Status: DC

## 2021-08-15 MED ORDER — PREDNISONE 20 MG PO TABS: 20.0000 mg | ORAL_TABLET | Freq: Every day | ORAL | 0 refills | Status: DC

## 2021-08-15 NOTE — Assessment & Plan Note (Signed)
Patient has had 2 recurrence of flares at this moment.  Will put patient back on the allopurinol 200 mg and bridge with a short course of prednisone at a lower dose of 20 mg.  Hopefully this will be beneficial.  Patient also may have the possibility of toe fractures noted on ultrasound.  We will monitor.  Patient declined a cam walker today.  We will need to get labs at follow-up in 2 to 4 weeks.  If her kidney function seems to deteriorate may need to consider Uloric

## 2021-08-15 NOTE — Assessment & Plan Note (Addendum)
Patient has fractures what appears to be the fifth and the fourth proximal phalanx on ultrasound.  Awaiting the x-rays at this moment.  Seems to be nondisplaced.  Discussed with patient that I do think a cam walker would be beneficial which patient declined.  Patient will go back into his lace up boots.  Discussed with patient about the allopurinol as well.  We will see how patient responds.  We will need to monitor patient's creatinine and consider the possibility if any deterioration in kidney function need to consider the Uloric.

## 2021-08-15 NOTE — Patient Instructions (Addendum)
Xray today Prednisone 20mg  for 3 days Allopurinol 200mg  starting now Ice foot  Wear rigid soled shoe See me again in 2-4 weeks-Ok to double book

## 2021-08-15 NOTE — Progress Notes (Signed)
[place] Phone: [phone] Subjective:   [name], am serving as a scribe for Dr. Hulan Saas. This visit occurred during the SARS-CoV-2 public health emergency.  Safety protocols were in place, including screening questions prior to the visit, additional usage of staff PPE, and extensive cleaning of exam room while observing appropriate contact time as indicated for disinfecting solutions.   I'm seeing this patient by the request  of:  Hoyt Koch, MD  CC: Left foot pain  [id number]  Terry Cummings is a 60 y.o. male coming in with complaint of L foot pain. Seen in 2020 for L heel pain. Did obtain custom orthotics in November of 2020. Patient states that he has pain in great toe, L foot. Patient called PCP and put on prednisone. Pain returned after finishing the course of prednisone.   Also hit pinky toe on L foot on a ramp at end of the bed last week. Caused bruising of 2 toes adjacent to pinky toe.       Past Medical History:  Diagnosis Date   Atypical nevus 07/26/2003   Upper Center Back-Slight to Moderate (w/s)   Backache, unspecified    Hypertension    Obesity, unspecified    Other and unspecified hyperlipidemia    Pain in joint, lower leg    Personal history of urinary calculi    Past Surgical History:  Procedure Laterality Date   excision of prepatellar burs     IRRIGATION AND DEBRIDEMENT, open, of infected prepatellar bursa     TONSILLECTOMY     Social History   Socioeconomic History   Marital status: Married    Spouse name: Not on file   Number of children: Not on file   Years of education: Not on file   Highest education level: Not on file  Occupational History   Occupation: welder  Tobacco Use   Smoking status: Never   Smokeless tobacco: Current    Types: Chew  Substance and Sexual Activity   Alcohol use: No   Drug use: No   Sexual activity: Not on file   Other Topics Concern   Not on file  Social History Narrative   Not on file   Social Determinants of Health   Financial Resource Strain: Not on file  Food Insecurity: Not on file  Transportation Needs: Not on file  Physical Activity: Not on file  Stress: Not on file  Social Connections: Not on file   Allergies  Allergen Reactions   Codeine     REACTION: nausea   Neomycin-Bacitracin Zn-Polymyx     REACTION: rash/itching   Family History  Problem Relation Age of Onset   Alcohol abuse Father    Cirrhosis Father    Brain cancer Father     Current Outpatient Medications (Endocrine & Metabolic):    predniSONE (DELTASONE) 20 MG tablet, Take 1 tablet (20 mg total) by mouth daily with breakfast.  Current Outpatient Medications (Cardiovascular):    lisinopril-hydrochlorothiazide (ZESTORETIC) 20-12.5 MG tablet, TAKE 1 TABLET BY MOUTH EVERY DAY   pravastatin (PRAVACHOL) 20 MG tablet, TAKE 1 TABLET BY MOUTH EVERY DAY   Current Outpatient Medications (Analgesics):    allopurinol (ZYLOPRIM) 100 MG tablet, Take 2 tablets (200 mg total) by mouth daily.   aspirin 81 MG tablet, Take 81 mg by mouth daily.    Reviewed prior external information including notes and imaging from  primary care provider As well as  notes that were available from care everywhere and other healthcare systems.  Past medical history, social, surgical and family history all reviewed in electronic medical record.  No pertanent information unless stated regarding to the chief complaint.   Review of Systems:  No headache, visual changes, nausea, vomiting, diarrhea, constipation, dizziness, abdominal pain, skin rash, fevers, chills, night sweats, weight loss, swollen lymph nodes, body aches, joint swelling, chest pain, shortness of breath, mood changes. POSITIVE muscle aches  Objective  Blood pressure 112/76, pulse 90, height 5\' 8"  (1.727 m), weight 228 lb (103.4 kg), SpO2 98 %.   General: No apparent distress  alert and oriented x3 mood and affect normal, dressed appropriately.  HEENT: Pupils equal, extraocular movements intact  Respiratory: Patient's speak in full sentences and does not appear short of breath  Cardiovascular: No lower extremity edema, non tender, no erythema  Gait antalgic MSK: Patient's left foot shows patient does have significant swelling noted of the left first toe.  Tender to palpation with any type of movement.  Patient does have bruising over the fourth and fifth phalanx and metatarsals.  Limited muscular skeletal ultrasound was performed and interpreted by Hulan Saas, M  Limited ultrasound of patient's first metatarsal shows hypoechoic changes within the likely gouty deposits noted and increasing in Doppler flow consistent with an acute gout flare. Patient also has what appears to be a nondisplaced and mildly displaced phalanx fracture of the proximal fifth.  Patient also has what appears to be a cortical irregularity noted of the fourth proximal phalanx as well. Impression: Gout flare of the first metatarsal and acute fractures of the fourth and fifth proximal phalanx    Impression and Recommendations:     The above documentation has been reviewed and is accurate and complete Terry Pulley, DO

## 2021-08-20 DIAGNOSIS — K5730 Diverticulosis of large intestine without perforation or abscess without bleeding: Secondary | ICD-10-CM | POA: Diagnosis not present

## 2021-08-20 DIAGNOSIS — K648 Other hemorrhoids: Secondary | ICD-10-CM | POA: Diagnosis not present

## 2021-08-20 DIAGNOSIS — D128 Benign neoplasm of rectum: Secondary | ICD-10-CM | POA: Diagnosis not present

## 2021-08-20 DIAGNOSIS — Z86010 Personal history of colonic polyps: Secondary | ICD-10-CM | POA: Diagnosis not present

## 2021-08-20 DIAGNOSIS — D124 Benign neoplasm of descending colon: Secondary | ICD-10-CM | POA: Diagnosis not present

## 2021-08-20 DIAGNOSIS — D122 Benign neoplasm of ascending colon: Secondary | ICD-10-CM | POA: Diagnosis not present

## 2021-08-27 NOTE — Progress Notes (Deleted)
[place] Phone: [phone] Subjective:    I'm seeing this patient by the request  of:  Hoyt Koch, MD  CC: Left foot pain follow-up  [id number]  Terry Cummings is a 60 y.o. male coming in with complaint of left foot pain.  Found to have toe fractures noted.  Seem to be in the fourth and likely fourth proximal phalanx.  Patient was to do rigid soled boots, icing regimen and vitamin D.  Patient states  Onset-  Location Duration-  Character- Aggravating factors- Reliving factors-  Therapies tried-  Severity-     Past Medical History:  Diagnosis Date   Atypical nevus 07/26/2003   Upper Center Back-Slight to Moderate (w/s)   Backache, unspecified    Hypertension    Obesity, unspecified    Other and unspecified hyperlipidemia    Pain in joint, lower leg    Personal history of urinary calculi    Past Surgical History:  Procedure Laterality Date   excision of prepatellar burs     IRRIGATION AND DEBRIDEMENT, open, of infected prepatellar bursa     TONSILLECTOMY     Social History   Socioeconomic History   Marital status: Married    Spouse name: Not on file   Number of children: Not on file   Years of education: Not on file   Highest education level: Not on file  Occupational History   Occupation: welder  Tobacco Use   Smoking status: Never   Smokeless tobacco: Current    Types: Chew  Substance and Sexual Activity   Alcohol use: No   Drug use: No   Sexual activity: Not on file  Other Topics Concern   Not on file  Social History Narrative   Not on file   Social Determinants of Health   Financial Resource Strain: Not on file  Food Insecurity: Not on file  Transportation Needs: Not on file  Physical Activity: Not on file  Stress: Not on file  Social Connections: Not on file   Allergies  Allergen Reactions   Codeine     REACTION: nausea   Neomycin-Bacitracin  Zn-Polymyx     REACTION: rash/itching   Family History  Problem Relation Age of Onset   Alcohol abuse Father    Cirrhosis Father    Brain cancer Father     Current Outpatient Medications (Endocrine & Metabolic):    predniSONE (DELTASONE) 20 MG tablet, Take 1 tablet (20 mg total) by mouth daily with breakfast.  Current Outpatient Medications (Cardiovascular):    lisinopril-hydrochlorothiazide (ZESTORETIC) 20-12.5 MG tablet, TAKE 1 TABLET BY MOUTH EVERY DAY   pravastatin (PRAVACHOL) 20 MG tablet, TAKE 1 TABLET BY MOUTH EVERY DAY   Current Outpatient Medications (Analgesics):    allopurinol (ZYLOPRIM) 100 MG tablet, Take 2 tablets (200 mg total) by mouth daily.   aspirin 81 MG tablet, Take 81 mg by mouth daily.      Review of Systems:  No headache, visual changes, nausea, vomiting, diarrhea, constipation, dizziness, abdominal pain, skin rash, fevers, chills, night sweats, weight loss, swollen lymph nodes, body aches, joint swelling, chest pain, shortness of breath, mood changes. POSITIVE muscle aches  Objective  There were no vitals taken for this visit.   General: No apparent distress alert and oriented x3 mood and affect normal, dressed appropriately.  HEENT: Pupils equal, extraocular movements intact  Respiratory: Patient's speak in full sentences and does not appear short of breath  Cardiovascular: No lower extremity edema, non tender, no erythema  Gait n    Impression and Recommendations:    The above documentation has been reviewed and is accurate and complete Lyndal Pulley, DO

## 2021-08-28 ENCOUNTER — Other Ambulatory Visit: Payer: Self-pay

## 2021-08-28 ENCOUNTER — Ambulatory Visit: Payer: BC Managed Care – PPO | Admitting: Family Medicine

## 2021-08-28 ENCOUNTER — Ambulatory Visit: Payer: Self-pay

## 2021-08-28 ENCOUNTER — Encounter: Payer: Self-pay | Admitting: Family Medicine

## 2021-08-28 VITALS — BP 126/72 | HR 70 | Ht 68.0 in | Wt 226.0 lb

## 2021-08-28 DIAGNOSIS — S92912A Unspecified fracture of left toe(s), initial encounter for closed fracture: Secondary | ICD-10-CM | POA: Diagnosis not present

## 2021-08-28 DIAGNOSIS — M1A00X0 Idiopathic chronic gout, unspecified site, without tophus (tophi): Secondary | ICD-10-CM | POA: Diagnosis not present

## 2021-08-28 DIAGNOSIS — M79672 Pain in left foot: Secondary | ICD-10-CM

## 2021-08-28 NOTE — Assessment & Plan Note (Signed)
Doing well overall but is going to continue to have some difficulty.  I do think that he will likely be healed in 4 weeks.  I do note some callus formation on ultrasound today.  Discussed continuing the rigid soled shoes.  Does have the underlying gout that is contributing to the large toe and needs to continue to monitor.

## 2021-08-28 NOTE — Progress Notes (Signed)
Terry Cummings and Terry Cummings Phone: [phone] Subjective:   Terry Cummings, am serving as a scribe for Dr. Hulan Saas.  This visit occurred during the SARS-CoV-2 public health emergency.  Safety protocols were in place, including screening questions prior to the visit, additional usage of staff PPE, and extensive cleaning of exam room while observing appropriate contact time as indicated for disinfecting solutions.   I'm seeing this patient by the request  of:  Hoyt Koch, MD  CC: Foot pain follow-up  SLH:[id number]  08/15/2021 Patient has fractures what appears to be the fifth and the fourth proximal phalanx on ultrasound.  Awaiting the x-rays at this moment.  Seems to be nondisplaced.  Discussed with patient that I do think a cam walker would be beneficial which patient declined.  Patient will go back into his lace up boots.  Discussed with patient about the allopurinol as well.  We will see how patient responds.  We will need to monitor patient's creatinine and consider the possibility if any deterioration in kidney function need to consider the Uloric.  Patient has had 2 recurrence of flares at this moment.  Will put patient back on the allopurinol 200 mg and bridge with a short course of prednisone at a lower dose of 20 mg.  Hopefully this will be beneficial.  Patient also may have the possibility of toe fractures noted on ultrasound.  We will monitor.  Patient declined a cam walker today.  We will need to get labs at follow-up in 2 to 4 weeks.  If her kidney function seems to deteriorate may need to consider Uloric  Update 08/28/2021 Terry Cummings is a 60 y.o. male coming in with complaint of L foot pain. Patient states that he is improving but that he continues to have pain with weightbearing.  Patient states as long as he wears the right shoe seems to do relatively well.  He does feel the prednisone has been helpful.   Allopurinol is causing him to urinate more he feels.  Denies any other side effects.  Xray L foot 08/15/2021 IMPRESSION: 1. Nondisplaced oblique fracture of the mid left fifth proximal phalanx. Cummings other fracture or dislocation of the left foot. 2. Bunion deformity of the left great toe with mild associated first metatarsophalangeal arthrosis.     Past Medical History:  Diagnosis Date   Atypical nevus 07/26/2003   Upper Center Back-Slight to Moderate (w/s)   Backache, unspecified    Hypertension    Obesity, unspecified    Other and unspecified hyperlipidemia    Pain in joint, lower leg    Personal history of urinary calculi    Past Surgical History:  Procedure Laterality Date   excision of prepatellar burs     IRRIGATION AND DEBRIDEMENT, open, of infected prepatellar bursa     TONSILLECTOMY     Social History   Socioeconomic History   Marital status: Married    Spouse name: Not on file   Number of children: Not on file   Years of education: Not on file   Highest education level: Not on file  Occupational History   Occupation: welder  Tobacco Use   Smoking status: Never   Smokeless tobacco: Current    Types: Chew  Substance and Sexual Activity   Alcohol use: Cummings   Drug use: Cummings   Sexual activity: Not on file  Other Topics Concern   Not on file  Social History  Narrative   Not on file   Social Determinants of Health   Financial Resource Strain: Not on file  Food Insecurity: Not on file  Transportation Needs: Not on file  Physical Activity: Not on file  Stress: Not on file  Social Connections: Not on file   Allergies  Allergen Reactions   Codeine     REACTION: nausea   Neomycin-Bacitracin Zn-Polymyx     REACTION: rash/itching   Family History  Problem Relation Age of Onset   Alcohol abuse Father    Cirrhosis Father    Brain cancer Father     Current Outpatient Medications (Endocrine & Metabolic):    predniSONE (DELTASONE) 20 MG tablet, Take 1 tablet  (20 mg total) by mouth daily with breakfast.  Current Outpatient Medications (Cardiovascular):    lisinopril-hydrochlorothiazide (ZESTORETIC) 20-12.5 MG tablet, TAKE 1 TABLET BY MOUTH EVERY DAY   pravastatin (PRAVACHOL) 20 MG tablet, TAKE 1 TABLET BY MOUTH EVERY DAY   Current Outpatient Medications (Analgesics):    allopurinol (ZYLOPRIM) 100 MG tablet, Take 2 tablets (200 mg total) by mouth daily.   aspirin 81 MG tablet, Take 81 mg by mouth daily.    Reviewed prior external information including notes and imaging from  primary care provider As well as notes that were available from care everywhere and other healthcare systems.  Past medical history, social, surgical and family history all reviewed in electronic medical record.  Cummings pertanent information unless stated regarding to the chief complaint.   Review of Systems:  Cummings headache, visual changes, nausea, vomiting, diarrhea, constipation, dizziness, abdominal pain, skin rash, fevers, chills, night sweats, weight loss, swollen lymph nodes, body aches, joint swelling, chest pain, shortness of breath, mood changes. POSITIVE muscle aches  Objective  Blood pressure 126/72, pulse 70, height 5\' 8"  (1.727 m), weight 226 lb (102.5 kg), SpO2 98 %.   General: Cummings apparent distress alert and oriented x3 mood and affect normal, dressed appropriately.  HEENT: Pupils equal, extraocular movements intact  Respiratory: Patient's speak in full sentences and does not appear short of breath  Cardiovascular: Cummings lower extremity edema, non tender, Cummings erythema  Gait antalgic MSK: Patient does have still some tenderness to palpation over the first metatarsal with some limited range of motion but less swelling than previous exam.  Patient does have pain over the fifth phalanx as well.  Limited muscular skeletal ultrasound was performed and interpreted by Hulan Saas, M   Limited ultrasound of patient's first metatarsal shows the patient still has some  hypoechoic changes.  The patient does have what appears to be chronic synovitis noted as well.  He does have some mild arthritic changes with narrowing of the joint noted.  Patient's phalanx does show some callus formation noted over the fifth ray patient did have the spiral fracture noted previously. Impression: Interval improvement of the gout as well as the nondisplaced fifth phalanx fracture    Impression and Recommendations:     The above documentation has been reviewed and is accurate and complete Lyndal Pulley, DO

## 2021-08-28 NOTE — Patient Instructions (Signed)
Tart cherry 1200mg  at night Change allopurinol for nighttime dosing If continuing to have urnation will try Uloric Continue rigid soled shoes See me in 4 weeks

## 2021-08-28 NOTE — Assessment & Plan Note (Signed)
Patient was started on allopurinol but does think he is having more polyuria.  Discussed with him to monitor.  Discussed changing to nighttime dosing that could be more beneficial and see if that goes okay otherwise we will need to change to Uloric follow-up with me again 4 weeks

## 2021-09-06 ENCOUNTER — Other Ambulatory Visit: Payer: Self-pay | Admitting: Family Medicine

## 2021-10-02 NOTE — Progress Notes (Signed)
Terry Cummings [place] [address] [place] Phone: [phone] Subjective:   Terry Cummings, am serving as a scribe for Dr. Hulan Saas. This visit occurred during the SARS-CoV-2 public health emergency.  Safety protocols were in place, including screening questions prior to the visit, additional usage of staff PPE, and extensive cleaning of exam room while observing appropriate contact time as indicated for disinfecting solutions.   I'm seeing this patient by the request  of:  Hoyt Koch, MD  CC: left foot pain   OBS:[id number]  08/28/2021 Patient was started on allopurinol but does think he is having more polyuria.  Discussed with him to monitor.  Discussed changing to nighttime dosing that could be more beneficial and see if that goes okay otherwise we will need to change to Uloric follow-up with me again 4 weeks  Doing well overall but is going to continue to have some difficulty.  I do think that he will likely be healed in 4 weeks.  I do note some callus formation on ultrasound today.  Discussed continuing the rigid soled shoes.  Does have the underlying gout that is contributing to the large toe and needs to continue to monitor.  Updated 10/03/2021 Terry Cummings is a 60 y.o. male coming in with complaint of left foot pain- found to have a toe fracture.  Past medical history significant for chronic gout.  X-rays of the patient's toe in November did show a nondisplaced oblique fracture of the left fourth proximal phalanx.  Patient states doing a lot better. Pain and frequency have decreased. Mid back pain started about a week or so ago. Sharp pain with movement more twisting than flexion.  Takes tylenol works sometimes.       Past Medical History:  Diagnosis Date   Atypical nevus 07/26/2003   Upper Center Back-Slight to Moderate (w/s)   Backache, unspecified    Hypertension    Obesity, unspecified    Other and unspecified  hyperlipidemia    Pain in joint, lower leg    Personal history of urinary calculi    Past Surgical History:  Procedure Laterality Date   excision of prepatellar burs     IRRIGATION AND DEBRIDEMENT, open, of infected prepatellar bursa     TONSILLECTOMY     Social History   Socioeconomic History   Marital status: Married    Spouse name: Not on file   Number of children: Not on file   Years of education: Not on file   Highest education level: Not on file  Occupational History   Occupation: welder  Tobacco Use   Smoking status: Never   Smokeless tobacco: Current    Types: Chew  Substance and Sexual Activity   Alcohol use: No   Drug use: No   Sexual activity: Not on file  Other Topics Concern   Not on file  Social History Narrative   Not on file   Social Determinants of Health   Financial Resource Strain: Not on file  Food Insecurity: Not on file  Transportation Needs: Not on file  Physical Activity: Not on file  Stress: Not on file  Social Connections: Not on file   Allergies  Allergen Reactions   Codeine     REACTION: nausea   Neomycin-Bacitracin Zn-Polymyx     REACTION: rash/itching   Family History  Problem Relation Age of Onset   Alcohol abuse Father    Cirrhosis Father    Brain cancer Father  Current Outpatient Medications (Endocrine & Metabolic):    predniSONE (DELTASONE) 20 MG tablet, Take 1 tablet (20 mg total) by mouth daily with breakfast.   predniSONE (DELTASONE) 20 MG tablet, Take 1 tablet (20 mg total) by mouth daily with breakfast.  Current Outpatient Medications (Cardiovascular):    lisinopril-hydrochlorothiazide (ZESTORETIC) 20-12.5 MG tablet, TAKE 1 TABLET BY MOUTH EVERY DAY   pravastatin (PRAVACHOL) 20 MG tablet, TAKE 1 TABLET BY MOUTH EVERY DAY   Current Outpatient Medications (Analgesics):    allopurinol (ZYLOPRIM) 100 MG tablet, TAKE 2 TABLETS BY MOUTH EVERY DAY   aspirin 81 MG tablet, Take 81 mg by mouth daily.     Reviewed  prior external information including notes and imaging from  primary care provider As well as notes that were available from care everywhere and other healthcare systems.  Past medical history, social, surgical and family history all reviewed in electronic medical record.  No pertanent information unless stated regarding to the chief complaint.   Review of Systems:  No headache, visual changes, nausea, vomiting, diarrhea, constipation, dizziness, abdominal pain, skin rash, fevers, chills, night sweats, weight loss, swollen lymph nodes, body aches, joint swelling, chest pain, shortness of breath, mood changes. POSITIVE muscle aches  Objective  Blood pressure 122/74, pulse 89, height 5\' 8"  (1.727 m), weight 230 lb (104.3 kg), SpO2 96 %.   General: No apparent distress alert and oriented x3 mood and affect normal, dressed appropriately.  HEENT: Pupils equal, extraocular movements intact  Respiratory: Patient's speak in full sentences and does not appear short of breath  Cardiovascular: No lower extremity edema, non tender, no erythema  Gait normal with good balance and coordination.  MSK: Foot exam is unremarkable Does have pes planus noted.  No tenderness.  Only felt foot condition today. Low back exam does have some CVA tenderness noted on the right side.  Pain seems to be more in the T10-T12 distribution on the right side.  No midline tenderness noted.   97110; 15 additional minutes spent for Therapeutic exercises as stated in above notes.  This included exercises focusing on stretching, strengthening, with significant focus on eccentric aspects.   Long term goals include an improvement in range of motion, strength, endurance as well as avoiding reinjury. Patient's frequency would include in 1-2 times a day, 3-5 times a week for a duration of 6-12 weeks.  Exercises that included:  Basic scapular stabilization to include adduction and depression of scapula Scaption, focusing on proper movement  and good control Internal and External rotation utilizing a theraband, with elbow tucked at side entire time Rows with theraband  Proper technique shown and discussed handout in great detail with ATC.  All questions were discussed and answered.      Impression and Recommendations:     The above documentation has been reviewed and is accurate and complete Lyndal Pulley, DO

## 2021-10-03 ENCOUNTER — Encounter: Payer: Self-pay | Admitting: Family Medicine

## 2021-10-03 ENCOUNTER — Ambulatory Visit: Payer: Self-pay

## 2021-10-03 ENCOUNTER — Other Ambulatory Visit: Payer: Self-pay

## 2021-10-03 ENCOUNTER — Ambulatory Visit: Payer: BC Managed Care – PPO | Admitting: Family Medicine

## 2021-10-03 ENCOUNTER — Ambulatory Visit (INDEPENDENT_AMBULATORY_CARE_PROVIDER_SITE_OTHER): Payer: BC Managed Care – PPO

## 2021-10-03 VITALS — BP 122/74 | HR 89 | Ht 68.0 in | Wt 230.0 lb

## 2021-10-03 DIAGNOSIS — S92912A Unspecified fracture of left toe(s), initial encounter for closed fracture: Secondary | ICD-10-CM

## 2021-10-03 DIAGNOSIS — M545 Low back pain, unspecified: Secondary | ICD-10-CM | POA: Diagnosis not present

## 2021-10-03 DIAGNOSIS — M549 Dorsalgia, unspecified: Secondary | ICD-10-CM

## 2021-10-03 DIAGNOSIS — M546 Pain in thoracic spine: Secondary | ICD-10-CM | POA: Diagnosis not present

## 2021-10-03 DIAGNOSIS — M1A00X0 Idiopathic chronic gout, unspecified site, without tophus (tophi): Secondary | ICD-10-CM | POA: Diagnosis not present

## 2021-10-03 DIAGNOSIS — M4316 Spondylolisthesis, lumbar region: Secondary | ICD-10-CM | POA: Diagnosis not present

## 2021-10-03 DIAGNOSIS — R109 Unspecified abdominal pain: Secondary | ICD-10-CM | POA: Diagnosis not present

## 2021-10-03 MED ORDER — PREDNISONE 20 MG PO TABS: 20.0000 mg | ORAL_TABLET | Freq: Every day | ORAL | 0 refills | Status: DC

## 2021-10-03 NOTE — Patient Instructions (Addendum)
Continue tart cherry Do prescribed exercises at least 3x a week Prednisone 20mg  daily for 5 days when needed on road See you again in 6-8 weeks just in case

## 2021-10-03 NOTE — Assessment & Plan Note (Signed)
History of the chronic wound.  Has started the tart cherry.  Discussed the possibility of the allopurinol patient is going to continue to stay active otherwise.  We discussed with patient to monitor the food choices.  Follow-up again in 6 to 8 weeks

## 2021-10-03 NOTE — Assessment & Plan Note (Addendum)
Patient is having some thoracic back pain.  Could be potentially secondary to a kidney stone.  Patient has had a history of 1 previously when reviewing patient's chart.  Patient denies that this is anything such as that at the moment.  Denies any fevers, chills, any dysuria.  We will get a KUB and a low back x-ray to further evaluate.  Discussed icing regimen and home exercises.  Follow-up with me again 6 to 8 weeks otherwise.  We will continue to have pain consider formal physical therapy.  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  Patient's patient will be traveling to give her a wait-and-see prescription for prednisone.

## 2021-10-05 ENCOUNTER — Other Ambulatory Visit: Payer: Self-pay | Admitting: Family Medicine

## 2021-11-02 ENCOUNTER — Other Ambulatory Visit: Payer: Self-pay | Admitting: Family Medicine

## 2021-12-01 ENCOUNTER — Other Ambulatory Visit: Payer: Self-pay | Admitting: Family Medicine

## 2021-12-03 ENCOUNTER — Other Ambulatory Visit: Payer: Self-pay

## 2021-12-03 MED ORDER — ALLOPURINOL 100 MG PO TABS: 200.0000 mg | ORAL_TABLET | Freq: Every day | ORAL | 0 refills | Status: DC

## 2022-02-16 ENCOUNTER — Other Ambulatory Visit: Payer: Self-pay | Admitting: Internal Medicine

## 2022-03-09 ENCOUNTER — Other Ambulatory Visit: Payer: Self-pay | Admitting: Family Medicine

## 2022-03-28 ENCOUNTER — Ambulatory Visit: Payer: BC Managed Care – PPO | Admitting: Sports Medicine

## 2022-03-28 VITALS — BP 130/86 | HR 86 | Ht 68.0 in | Wt 224.0 lb

## 2022-03-28 DIAGNOSIS — M7662 Achilles tendinitis, left leg: Secondary | ICD-10-CM | POA: Diagnosis not present

## 2022-03-28 MED ORDER — MELOXICAM 15 MG PO TABS: 15.0000 mg | ORAL_TABLET | Freq: Every day | ORAL | 0 refills | Status: DC

## 2022-03-28 NOTE — Patient Instructions (Addendum)
Good to see you  - Start meloxicam 15 mg daily x2 weeks.  If still having pain after 2 weeks, complete 3rd-week of meloxicam. May use remaining meloxicam as needed once daily for pain control.  Do not to use additional NSAIDs while taking meloxicam.  May use Tylenol [phone] mg 2 to 3 times a day for breakthrough pain. Recommend wearing supportive boots with fully laced Achilles HEP  3 week follow up

## 2022-04-17 NOTE — Progress Notes (Unsigned)
    Terry Cummings D.[place] Phone: [phone]   Assessment and Plan:     There are no diagnoses linked to this encounter.  ***   Pertinent previous records reviewed include ***   Follow Up: ***     Subjective:   I, Terry Cummings, am serving as a Education administrator for Doctor Terry Cummings  Chief Complaint: Heel pain   HPI:  08/28/2021 Patient was started on allopurinol but does think he is having more polyuria.  Discussed with him to monitor.  Discussed changing to nighttime dosing that could be more beneficial and see if that goes okay otherwise we will need to change to Uloric follow-up with me again 4 weeks   Doing well overall but is going to continue to have some difficulty.  I do think that he will likely be healed in 4 weeks.  I do note some callus formation on ultrasound today.  Discussed continuing the rigid soled shoes.  Does have the underlying gout that is contributing to the large toe and needs to continue to monitor.   Updated 10/03/2021 Terry Cummings is a 61 y.o. male coming in with complaint of left foot pain- found to have a toe fracture.  Past medical history significant for chronic gout.  X-rays of the patient's toe in November did show a nondisplaced oblique fracture of the left fourth proximal phalanx.  Patient states doing a lot better. Pain and frequency have decreased. Mid back pain started about a week or so ago. Sharp pain with movement more twisting than flexion.  Takes tylenol works sometimes.   03/28/22 Patient states that he has left heel and right knee pain, left heel has been hurting for about 2-3 days and the right knee started last night , no MOI for both, hx of this but he was able to work through it about a month a go heels feels like there is a knot in it and the knee feels like it comes apart and then comes back together , no numbness or tingling either places, has been taking  tylenol for the pain but that doesn't touch the pain   04/18/2022 Patient states    Relevant Historical Information: Hypertension, hyperlipidemia  Additional pertinent review of systems negative.   Current Outpatient Medications:    allopurinol (ZYLOPRIM) 100 MG tablet, TAKE 2 TABLETS BY MOUTH EVERY DAY, Disp: 180 tablet, Rfl: 0   aspirin 81 MG tablet, Take 81 mg by mouth daily., Disp: , Rfl:    lisinopril-hydrochlorothiazide (ZESTORETIC) 20-12.5 MG tablet, TAKE 1 TABLET BY MOUTH EVERY DAY, Disp: 90 tablet, Rfl: 2   meloxicam (MOBIC) 15 MG tablet, Take 1 tablet (15 mg total) by mouth daily., Disp: 30 tablet, Rfl: 0   pravastatin (PRAVACHOL) 20 MG tablet, TAKE 1 TABLET BY MOUTH EVERY DAY, Disp: 90 tablet, Rfl: 2   predniSONE (DELTASONE) 20 MG tablet, Take 1 tablet (20 mg total) by mouth daily with breakfast., Disp: 3 tablet, Rfl: 0   predniSONE (DELTASONE) 20 MG tablet, Take 1 tablet (20 mg total) by mouth daily with breakfast., Disp: 10 tablet, Rfl: 0   Objective:     There were no vitals filed for this visit.    There is no height or weight on file to calculate BMI.    Physical Exam:    ***   Electronically signed by:  Terry Cummings D.[facility] 2:35 PM 04/17/22

## 2022-04-18 ENCOUNTER — Ambulatory Visit: Payer: BC Managed Care – PPO | Admitting: Sports Medicine

## 2022-04-18 VITALS — BP 136/72 | HR 74 | Ht 68.0 in | Wt 223.0 lb

## 2022-04-18 DIAGNOSIS — M7662 Achilles tendinitis, left leg: Secondary | ICD-10-CM

## 2022-05-23 ENCOUNTER — Encounter: Payer: Self-pay | Admitting: Internal Medicine

## 2022-05-23 ENCOUNTER — Ambulatory Visit (INDEPENDENT_AMBULATORY_CARE_PROVIDER_SITE_OTHER): Payer: BC Managed Care – PPO | Admitting: Internal Medicine

## 2022-05-23 VITALS — BP 132/80 | HR 57 | Temp 98.0°F | Ht 68.0 in | Wt 222.0 lb

## 2022-05-23 DIAGNOSIS — I10 Essential (primary) hypertension: Secondary | ICD-10-CM

## 2022-05-23 DIAGNOSIS — E782 Mixed hyperlipidemia: Secondary | ICD-10-CM | POA: Diagnosis not present

## 2022-05-23 DIAGNOSIS — Z0000 Encounter for general adult medical examination without abnormal findings: Secondary | ICD-10-CM

## 2022-05-23 DIAGNOSIS — R7301 Impaired fasting glucose: Secondary | ICD-10-CM | POA: Diagnosis not present

## 2022-05-23 DIAGNOSIS — M1A00X0 Idiopathic chronic gout, unspecified site, without tophus (tophi): Secondary | ICD-10-CM

## 2022-05-23 LAB — COMPREHENSIVE METABOLIC PANEL
ALT: 14 U/L (ref 0–53)
AST: 14 U/L (ref 0–37)
Albumin: 4 g/dL (ref 3.5–5.2)
Alkaline Phosphatase: 36 U/L — ABNORMAL LOW (ref 39–117)
BUN: 26 mg/dL — ABNORMAL HIGH (ref 6–23)
CO2: 27 mEq/L (ref 19–32)
Calcium: 9.6 mg/dL (ref 8.4–10.5)
Chloride: 105 mEq/L (ref 96–112)
Creatinine, Ser: 1.5 mg/dL (ref 0.40–1.50)
GFR: 49.31 mL/min — ABNORMAL LOW (ref >60.00)
Glucose, Bld: 100 mg/dL — ABNORMAL HIGH (ref 70–99)
Potassium: 4.5 mEq/L (ref 3.5–5.1)
Sodium: 137 mEq/L (ref 135–145)
Total Bilirubin: 0.3 mg/dL (ref 0.2–1.2)
Total Protein: 6.7 g/dL (ref 6.0–8.3)

## 2022-05-23 LAB — CBC
HCT: 37.4 % — ABNORMAL LOW (ref 39.0–52.0)
Hemoglobin: 12.3 g/dL — ABNORMAL LOW (ref 13.0–17.0)
MCHC: 32.9 g/dL (ref 30.0–36.0)
MCV: 88.7 fl (ref 78.0–100.0)
Platelets: 230 10*3/uL (ref 150.0–400.0)
RBC: 4.21 Mil/uL — ABNORMAL LOW (ref 4.22–5.81)
RDW: 13.8 % (ref 11.5–15.5)
WBC: 5.7 10*3/uL (ref 4.0–10.5)

## 2022-05-23 LAB — LIPID PANEL
Cholesterol: 178 mg/dL (ref 0–200)
HDL: 38.9 mg/dL — ABNORMAL LOW (ref >39.00)
LDL Cholesterol: 114 mg/dL — ABNORMAL HIGH (ref 0–99)
NonHDL: 139.54
Total CHOL/HDL Ratio: 5
Triglycerides: 128 mg/dL (ref 0.0–149.0)
VLDL: 25.6 mg/dL (ref 0.0–40.0)

## 2022-05-23 LAB — URIC ACID: Uric Acid, Serum: 8.4 mg/dL — ABNORMAL HIGH (ref 4.0–7.8)

## 2022-05-23 LAB — HEMOGLOBIN A1C: Hgb A1c MFr Bld: 6.3 % (ref 4.6–6.5)

## 2022-05-23 NOTE — Assessment & Plan Note (Signed)
Checking lipid panel and adjust pravastatin 20 mg daily as needed. 

## 2022-05-23 NOTE — Assessment & Plan Note (Signed)
Checking HgA1c and adjust as needed.  

## 2022-05-23 NOTE — Progress Notes (Signed)
   Subjective:   Patient ID: Terry Cummings, male    DOB: 03-19-1961, 61 y.o.   MRN: [id number]  HPI The patient is here for physical.  PMH, [facility], social history reviewed and updated  Review of Systems  Constitutional: Negative.   HENT: Negative.    Eyes: Negative.   Respiratory:  Negative for cough, chest tightness and shortness of breath.   Cardiovascular:  Negative for chest pain, palpitations and leg swelling.  Gastrointestinal:  Negative for abdominal distention, abdominal pain, constipation, diarrhea, nausea and vomiting.  Musculoskeletal: Negative.   Skin: Negative.   Neurological: Negative.   Psychiatric/Behavioral: Negative.      Objective:  Physical Exam Constitutional:      Appearance: He is well-developed.  HENT:     Head: Normocephalic and atraumatic.  Cardiovascular:     Rate and Rhythm: Normal rate and regular rhythm.  Pulmonary:     Effort: Pulmonary effort is normal. No respiratory distress.     Breath sounds: Normal breath sounds. No wheezing or rales.  Abdominal:     General: Bowel sounds are normal. There is no distension.     Palpations: Abdomen is soft.     Tenderness: There is no abdominal tenderness. There is no rebound.  Musculoskeletal:     Cervical back: Normal range of motion.  Skin:    General: Skin is warm and dry.  Neurological:     Mental Status: He is alert and oriented to person, place, and time.     Coordination: Coordination normal.     Vitals:   05/23/22 0756  BP: 132/80  Pulse: (!) 57  Temp: 98 F (36.7 C)  TempSrc: Oral  SpO2: 98%  Weight: 222 lb (100.7 kg)  Height: '5\' 8"'$  (1.727 m)    Assessment & Plan:

## 2022-05-23 NOTE — Assessment & Plan Note (Signed)
BP at goal on lisinopril/hctz 20/12.5 mg daily. Checking CMP and adjust as needed.

## 2022-05-23 NOTE — Assessment & Plan Note (Signed)
Checking uric acid and adjust allopurinol 200 mg daily for goal <6.

## 2022-05-23 NOTE — Assessment & Plan Note (Signed)
Flu shot yearly. Covid-19 counseled. Shingrix complete. Tetanus up to date. Colonoscopy up to date. Counseled about sun safety and mole surveillance. Counseled about the dangers of distracted driving. Given 10 year screening recommendations.

## 2022-05-23 NOTE — Patient Instructions (Addendum)
We will check the labs today. 

## 2022-05-27 ENCOUNTER — Other Ambulatory Visit: Payer: Self-pay | Admitting: Internal Medicine

## 2022-05-27 MED ORDER — ALLOPURINOL 300 MG PO TABS: 300.0000 mg | ORAL_TABLET | Freq: Every day | ORAL | 3 refills | Status: DC

## 2022-06-09 ENCOUNTER — Other Ambulatory Visit: Payer: Self-pay | Admitting: Family Medicine

## 2022-11-12 ENCOUNTER — Other Ambulatory Visit: Payer: Self-pay | Admitting: Internal Medicine

## 2023-01-20 ENCOUNTER — Encounter: Payer: Self-pay | Admitting: *Deleted

## 2023-08-22 ENCOUNTER — Ambulatory Visit: Payer: PRIVATE HEALTH INSURANCE | Admitting: Internal Medicine

## 2023-08-22 ENCOUNTER — Encounter: Payer: Self-pay | Admitting: Internal Medicine

## 2023-08-22 VITALS — BP 124/68 | HR 57 | Temp 98.4°F | Ht 68.0 in | Wt 233.0 lb

## 2023-08-22 DIAGNOSIS — E782 Mixed hyperlipidemia: Secondary | ICD-10-CM | POA: Diagnosis not present

## 2023-08-22 DIAGNOSIS — R7301 Impaired fasting glucose: Secondary | ICD-10-CM

## 2023-08-22 DIAGNOSIS — Z0000 Encounter for general adult medical examination without abnormal findings: Secondary | ICD-10-CM | POA: Diagnosis not present

## 2023-08-22 DIAGNOSIS — M1A00X0 Idiopathic chronic gout, unspecified site, without tophus (tophi): Secondary | ICD-10-CM

## 2023-08-22 DIAGNOSIS — Z23 Encounter for immunization: Secondary | ICD-10-CM

## 2023-08-22 DIAGNOSIS — I10 Essential (primary) hypertension: Secondary | ICD-10-CM

## 2023-08-22 LAB — URINALYSIS, ROUTINE W REFLEX MICROSCOPIC
Bilirubin Urine: NEGATIVE
Hgb urine dipstick: NEGATIVE
Ketones, ur: NEGATIVE
Leukocytes,Ua: NEGATIVE
Nitrite: NEGATIVE
RBC / HPF: NONE SEEN (ref 0–?)
Specific Gravity, Urine: 1.015 (ref 1.000–1.030)
Total Protein, Urine: NEGATIVE
Urine Glucose: NEGATIVE
Urobilinogen, UA: 0.2 (ref 0.0–1.0)
pH: 6 (ref 5.0–8.0)

## 2023-08-22 LAB — COMPREHENSIVE METABOLIC PANEL
ALT: 16 U/L (ref 0–53)
AST: 12 U/L (ref 0–37)
Albumin: 4.3 g/dL (ref 3.5–5.2)
Alkaline Phosphatase: 46 U/L (ref 39–117)
BUN: 24 mg/dL — ABNORMAL HIGH (ref 6–23)
CO2: 28 meq/L (ref 19–32)
Calcium: 9.6 mg/dL (ref 8.4–10.5)
Chloride: 103 meq/L (ref 96–112)
Creatinine, Ser: 1.74 mg/dL — ABNORMAL HIGH (ref 0.40–1.50)
GFR: 40.9 mL/min — ABNORMAL LOW (ref >60.00)
Glucose, Bld: 100 mg/dL — ABNORMAL HIGH (ref 70–99)
Potassium: 4.4 meq/L (ref 3.5–5.1)
Sodium: 138 meq/L (ref 135–145)
Total Bilirubin: 0.4 mg/dL (ref 0.2–1.2)
Total Protein: 7.3 g/dL (ref 6.0–8.3)

## 2023-08-22 LAB — LIPID PANEL
Cholesterol: 171 mg/dL (ref 0–200)
HDL: 33.3 mg/dL — ABNORMAL LOW (ref >39.00)
LDL Cholesterol: 111 mg/dL — ABNORMAL HIGH (ref 0–99)
NonHDL: 137.85
Total CHOL/HDL Ratio: 5
Triglycerides: 132 mg/dL (ref 0.0–149.0)
VLDL: 26.4 mg/dL (ref 0.0–40.0)

## 2023-08-22 LAB — CBC
HCT: 43.3 % (ref 39.0–52.0)
Hemoglobin: 14 g/dL (ref 13.0–17.0)
MCHC: 32.2 g/dL (ref 30.0–36.0)
MCV: 89.6 fL (ref 78.0–100.0)
Platelets: 249 10*3/uL (ref 150.0–400.0)
RBC: 4.83 Mil/uL (ref 4.22–5.81)
RDW: 13.7 % (ref 11.5–15.5)
WBC: 6.1 10*3/uL (ref 4.0–10.5)

## 2023-08-22 LAB — HEMOGLOBIN A1C: Hgb A1c MFr Bld: 6.3 % (ref 4.6–6.5)

## 2023-08-22 LAB — URIC ACID: Uric Acid, Serum: 10.1 mg/dL — ABNORMAL HIGH (ref 4.0–7.8)

## 2023-08-22 MED ORDER — ALLOPURINOL 300 MG PO TABS
300.0000 mg | ORAL_TABLET | Freq: Every day | ORAL | 3 refills | Status: DC
Start: 2023-08-22 — End: 2023-09-08

## 2023-08-22 MED ORDER — LISINOPRIL-HYDROCHLOROTHIAZIDE 20-12.5 MG PO TABS: 1.0000 | ORAL_TABLET | Freq: Every day | ORAL | 3 refills | Status: DC

## 2023-08-22 MED ORDER — PRAVASTATIN SODIUM 20 MG PO TABS: 20.0000 mg | ORAL_TABLET | Freq: Every day | ORAL | 3 refills | Status: DC

## 2023-08-22 NOTE — Assessment & Plan Note (Signed)
BP at goal on lisinopril/hydrochlorothiazide and checking CMP and adjust as needed.

## 2023-08-22 NOTE — Assessment & Plan Note (Signed)
Flu shot declines. Shingrix complete. Tetanus given. Colonoscopy up to date. Counseled about sun safety and mole surveillance. Counseled about the dangers of distracted driving. Given 10 year screening recommendations.

## 2023-08-22 NOTE — Assessment & Plan Note (Signed)
Checking HgA1c and adjust as needed.  

## 2023-08-22 NOTE — Assessment & Plan Note (Signed)
Checking uric acid and adjust allopurinol 300 mg daily as needed.  ?

## 2023-08-22 NOTE — Assessment & Plan Note (Signed)
Checking lipid panel and adjust pravastatin 20 mg daily as needed. 

## 2023-08-22 NOTE — Progress Notes (Signed)
   Subjective:   Patient ID: Terry Cummings, male    DOB: Feb 05, 1961, 62 y.o.   MRN: [id number]  HPI The patient is here for physical.  PMH, [facility], social history reviewed and updated  Review of Systems  Constitutional: Negative.   HENT: Negative.    Eyes: Negative.   Respiratory:  Negative for cough, chest tightness and shortness of breath.   Cardiovascular:  Negative for chest pain, palpitations and leg swelling.  Gastrointestinal:  Negative for abdominal distention, abdominal pain, constipation, diarrhea, nausea and vomiting.  Musculoskeletal: Negative.   Skin: Negative.   Neurological: Negative.   Psychiatric/Behavioral: Negative.      Objective:  Physical Exam Constitutional:      Appearance: He is well-developed.  HENT:     Head: Normocephalic and atraumatic.  Cardiovascular:     Rate and Rhythm: Normal rate and regular rhythm.  Pulmonary:     Effort: Pulmonary effort is normal. No respiratory distress.     Breath sounds: Normal breath sounds. No wheezing or rales.  Abdominal:     General: Bowel sounds are normal. There is no distension.     Palpations: Abdomen is soft.     Tenderness: There is no abdominal tenderness. There is no rebound.  Musculoskeletal:     Cervical back: Normal range of motion.  Skin:    General: Skin is warm and dry.  Neurological:     Mental Status: He is alert and oriented to person, place, and time.     Coordination: Coordination normal.     Vitals:   08/22/23 0818  BP: 124/68  Pulse: (!) 57  Temp: 98.4 F (36.9 C)  TempSrc: Oral  SpO2: 95%  Weight: 233 lb (105.7 kg)  Height: 5\' 8"  (1.727 m)    Assessment & Plan:  Tdap given at visit

## 2023-09-08 ENCOUNTER — Other Ambulatory Visit: Payer: Self-pay

## 2023-09-08 ENCOUNTER — Telehealth: Payer: Self-pay | Admitting: Internal Medicine

## 2023-09-08 MED ORDER — ALLOPURINOL 300 MG PO TABS: 300.0000 mg | ORAL_TABLET | Freq: Every day | ORAL | 3 refills | Status: DC

## 2023-09-08 NOTE — Telephone Encounter (Signed)
Please call patient at:  [phone]

## 2023-09-08 NOTE — Telephone Encounter (Signed)
Prescription Request  09/08/2023  LOV: 08/22/2023  What is the name of the medication or equipment? allopurinol  Have you contacted your pharmacy to request a refill? Yes   Which pharmacy would you like this sent to?  [facility] at [facility], [place]  Phone:  [phone]   Patient notified that their request is being sent to the clinical staff for review and that they should receive a response within 2 business days.   Please advise at Mobile [phone] (mobile)

## 2024-03-12 ENCOUNTER — Other Ambulatory Visit: Payer: Self-pay | Admitting: Internal Medicine

## 2024-06-09 ENCOUNTER — Other Ambulatory Visit: Payer: Self-pay | Admitting: Internal Medicine

## 2024-07-05 ENCOUNTER — Ambulatory Visit: Payer: Self-pay

## 2024-07-05 NOTE — Telephone Encounter (Signed)
 FYI Only or Action Required?: Action required by provider: request for appointment. For This Friday. Pt is out of town until Thursday night  Patient was last seen in primary care on 08/22/2023 by Rollene Almarie LABOR, MD.  Called Nurse Triage reporting Back Pain. - Lower right side. Sometimes has difficulty starting to urinate  Symptoms began a week ago.  Interventions attempted: OTC medications: Tylenol and IBU.  Symptoms are: unchanged.  Triage Disposition: See PCP When Office is Open (Within 3 Days)  Patient/caregiver understands and will follow disposition?: Yes - Pt will go to UC if needed. Wants to be seen by PCP on Friday.                Copied from CRM #[id number]. Topic: Clinical - Red Word Triage >> Jul 05, 2024  9:02 AM Rosina BIRCH wrote: Reason for RMF:ajrx pain has been hurting since the weekend Reason for Disposition  [1] MODERATE back pain (e.g., interferes with normal activities) AND [2] present > 3 days  Answer Assessment - Initial Assessment Questions 1. ONSET: When did the pain begin? (e.g., minutes, hours, days)     Last week  2. LOCATION: Where does it hurt? (upper, mid or lower back)     Bottom right side 3. SEVERITY: How bad is the pain?  (e.g., Scale 1-10; mild, moderate, or severe)     Throbs 4. PATTERN: Is the pain constant? (e.g., yes, no; constant, intermittent)      Comes and goes 5. RADIATION: Does the pain shoot into your legs or somewhere else?     no 6. CAUSE:  What do you think is causing the back pain?      Unsure 7. BACK OVERUSE:  Any recent lifting of heavy objects, strenuous work or exercise?     no 8. MEDICINES: What have you taken so far for the pain? (e.g., nothing, acetaminophen, NSAIDS)     Tylenol, IBU 9. NEUROLOGIC SYMPTOMS: Do you have any weakness, numbness, or problems with bowel/bladder control?     Had to pee 10. OTHER SYMPTOMS: Do you have any other symptoms? (e.g., fever, abdomen pain, burning  with urination, blood in urine)       Hard to pee  Protocols used: Back Pain-A-AH

## 2024-07-05 NOTE — Telephone Encounter (Signed)
 I don't think you availability on Friday

## 2024-07-05 NOTE — Telephone Encounter (Signed)
 Ok to schedule him apt on Friday with anyone

## 2024-07-05 NOTE — Telephone Encounter (Signed)
 Pt has been scheduled.

## 2024-07-09 ENCOUNTER — Encounter: Payer: Self-pay | Admitting: Family Medicine

## 2024-07-09 ENCOUNTER — Ambulatory Visit (INDEPENDENT_AMBULATORY_CARE_PROVIDER_SITE_OTHER): Payer: PRIVATE HEALTH INSURANCE

## 2024-07-09 ENCOUNTER — Ambulatory Visit: Payer: Self-pay | Admitting: Family Medicine

## 2024-07-09 ENCOUNTER — Ambulatory Visit: Admitting: Family Medicine

## 2024-07-09 VITALS — BP 116/84 | HR 70 | Temp 97.6°F | Ht 68.0 in | Wt 229.0 lb

## 2024-07-09 DIAGNOSIS — M545 Low back pain, unspecified: Secondary | ICD-10-CM | POA: Diagnosis not present

## 2024-07-09 DIAGNOSIS — Z87442 Personal history of urinary calculi: Secondary | ICD-10-CM | POA: Diagnosis not present

## 2024-07-09 DIAGNOSIS — R82998 Other abnormal findings in urine: Secondary | ICD-10-CM | POA: Diagnosis not present

## 2024-07-09 LAB — COMPREHENSIVE METABOLIC PANEL WITH GFR
ALT: 13 U/L (ref 0–53)
AST: 13 U/L (ref 0–37)
Albumin: 4.1 g/dL (ref 3.5–5.2)
Alkaline Phosphatase: 49 U/L (ref 39–117)
BUN: 26 mg/dL — ABNORMAL HIGH (ref 6–23)
CO2: 27 meq/L (ref 19–32)
Calcium: 10 mg/dL (ref 8.4–10.5)
Chloride: 106 meq/L (ref 96–112)
Creatinine, Ser: 1.79 mg/dL — ABNORMAL HIGH (ref 0.40–1.50)
GFR: 39.29 mL/min — ABNORMAL LOW (ref >60.00)
Glucose, Bld: 92 mg/dL (ref 70–99)
Potassium: 3.8 meq/L (ref 3.5–5.1)
Sodium: 140 meq/L (ref 135–145)
Total Bilirubin: 0.3 mg/dL (ref 0.2–1.2)
Total Protein: 7.6 g/dL (ref 6.0–8.3)

## 2024-07-09 LAB — CBC WITH DIFFERENTIAL/PLATELET
Basophils Absolute: 0.1 K/uL (ref 0.0–0.1)
Basophils Relative: 0.5 % (ref 0.0–3.0)
Eosinophils Absolute: 0.3 K/uL (ref 0.0–0.7)
Eosinophils Relative: 2.7 % (ref 0.0–5.0)
HCT: 36.8 % — ABNORMAL LOW (ref 39.0–52.0)
Hemoglobin: 11.8 g/dL — ABNORMAL LOW (ref 13.0–17.0)
Lymphocytes Relative: 17.9 % (ref 12.0–46.0)
Lymphs Abs: 1.8 K/uL (ref 0.7–4.0)
MCHC: 32.1 g/dL (ref 30.0–36.0)
MCV: 90.3 fl (ref 78.0–100.0)
Monocytes Absolute: 0.8 K/uL (ref 0.1–1.0)
Monocytes Relative: 7.7 % (ref 3.0–12.0)
Neutro Abs: 7.1 K/uL (ref 1.4–7.7)
Neutrophils Relative %: 71.2 % (ref 43.0–77.0)
Platelets: 271 K/uL (ref 150.0–400.0)
RBC: 4.08 Mil/uL — ABNORMAL LOW (ref 4.22–5.81)
RDW: 14.8 % (ref 11.5–15.5)
WBC: 9.9 K/uL (ref 4.0–10.5)

## 2024-07-09 LAB — POC URINALSYSI DIPSTICK (AUTOMATED)
Bilirubin, UA: NEGATIVE
Blood, UA: NEGATIVE
Glucose, UA: NEGATIVE
Ketones, UA: NEGATIVE
Nitrite, UA: POSITIVE
Protein, UA: NEGATIVE
Spec Grav, UA: 1.025 (ref 1.010–1.025)
Urobilinogen, UA: 0.2 U/dL
pH, UA: 6 (ref 5.0–8.0)

## 2024-07-09 MED ORDER — MELOXICAM 15 MG PO TABS: 15.0000 mg | ORAL_TABLET | Freq: Every day | ORAL | 0 refills | Status: DC

## 2024-07-09 MED ORDER — SULFAMETHOXAZOLE-TRIMETHOPRIM 800-160 MG PO TABS: 1.0000 | ORAL_TABLET | Freq: Two times a day (BID) | ORAL | 0 refills | Status: DC

## 2024-07-09 NOTE — Progress Notes (Signed)
 Subjective:     Patient ID: Terry Cummings, male    DOB: 11-02-1960, 63 y.o.   MRN: [id number]  Chief Complaint  Patient presents with   Back Pain    Lower back pain for about a week and a half. Throbbing and ache pain. Tried Tylenol and ibuprofen, has only dulled it a litttle    HPI  Discussed the use of AI scribe software for clinical note transcription with the patient, who gave verbal consent to proceed.  History of Present Illness Terry Cummings is a 63 year old male with a history of kidney stones who presents with lower back pain.  Lower back pain - Aching and throbbing pain localized to the right lower back for the past ten days - Pain is worse in the morning and improves with movement - No radiation of pain - No associated numbness, tingling, or leg pain - No abdominal pain - Tylenol and ibuprofen provide limited relief  Constitutional symptoms - Initial fever and chills at onset of back pain, now resolved - No current fever or chills  Urinary symptoms - History of kidney stones with two episodes four to five years ago - Difficulty urinating one month ago following COVID-19 infection, now resolved - No current urinary symptoms  Gastrointestinal symptoms - No nausea or vomiting  Medication allergies - Allergic to codeine and an unspecified antibiotic    Health Maintenance Due  Topic Date Due   Medicare Annual Wellness (AWV)  Never done   Pneumococcal Vaccine: 50+ Years (1 of 1 - PCV) Never done   Colonoscopy  08/20/2024    Past Medical History:  Diagnosis Date   Atypical nevus 07/26/2003   Upper Center Back-Slight to Moderate (w/s)   Backache, unspecified    Hypertension    Obesity, unspecified    Other and unspecified hyperlipidemia    Pain in joint, lower leg    Personal history of urinary calculi     Past Surgical History:  Procedure Laterality Date   excision of prepatellar burs     IRRIGATION AND DEBRIDEMENT, open, of infected  prepatellar bursa     TONSILLECTOMY      Family History  Problem Relation Age of Onset   Alcohol abuse Father    Cirrhosis Father    Brain cancer Father     Social History   Socioeconomic History   Marital status: Married    Spouse name: Not on file   Number of children: Not on file   Years of education: Not on file   Highest education level: Not on file  Occupational History   Occupation: welder  Tobacco Use   Smoking status: Never   Smokeless tobacco: Current    Types: Chew  Substance and Sexual Activity   Alcohol use: No   Drug use: No   Sexual activity: Not on file  Other Topics Concern   Not on file  Social History Narrative   Not on file   Social Drivers of Health   Financial Resource Strain: Not on file  Food Insecurity: Not on file  Transportation Needs: Not on file  Physical Activity: Not on file  Stress: Not on file  Social Connections: Not on file  Intimate Partner Violence: Not on file    Outpatient Medications Prior to Visit  Medication Sig Dispense Refill   allopurinol  (ZYLOPRIM ) 300 MG tablet Take 1 tablet (300 mg total) by mouth daily. 90 tablet 3   aspirin 81 MG tablet Take 81  mg by mouth daily.     lisinopril -hydrochlorothiazide  (ZESTORETIC ) 20-12.5 MG tablet TAKE 1 TABLET BY MOUTH DAILY 90 tablet 0   pravastatin  (PRAVACHOL ) 20 MG tablet TAKE 1 TABLET BY MOUTH DAILY 90 tablet 0   meloxicam  (MOBIC ) 15 MG tablet Take 1 tablet (15 mg total) by mouth daily. 30 tablet 0   No facility-administered medications prior to visit.    Allergies  Allergen Reactions   Codeine     REACTION: nausea   Neomycin-Bacitracin Zn-Polymyx     REACTION: rash/itching    ROS Per HPI     Objective:    Physical Exam Constitutional:      General: He is not in acute distress.    Appearance: He is not ill-appearing.  HENT:     Mouth/Throat:     Mouth: Mucous membranes are moist.     Pharynx: Oropharynx is clear.  Eyes:     Extraocular Movements:  Extraocular movements intact.     Conjunctiva/sclera: Conjunctivae normal.  Cardiovascular:     Rate and Rhythm: Normal rate and regular rhythm.  Pulmonary:     Effort: Pulmonary effort is normal.     Breath sounds: Normal breath sounds.  Abdominal:     General: There is no distension.     Palpations: Abdomen is soft.     Tenderness: There is no abdominal tenderness. There is no right CVA tenderness or left CVA tenderness.  Musculoskeletal:     Cervical back: Normal, normal range of motion and neck supple.     Thoracic back: Normal.     Lumbar back: Tenderness present. No bony tenderness. Negative right straight leg raise test and negative left straight leg raise test.     Comments: Right lumbar paraspinal muscle TTP without midline tenderness. Good motion of back, pain with extreme flexion and rotation   Skin:    General: Skin is warm and dry.  Neurological:     General: No focal deficit present.     Mental Status: He is alert and oriented to person, place, and time.     Motor: No weakness.     Coordination: Coordination normal.     Gait: Gait normal.  Psychiatric:        Mood and Affect: Mood normal.        Behavior: Behavior normal.        Thought Content: Thought content normal.      BP 116/84   Pulse 70   Temp 97.6 F (36.4 C) (Temporal)   Ht 5' 8 (1.727 m)   Wt 229 lb (103.9 kg)   SpO2 99%   BMI 34.82 kg/m  Wt Readings from Last 3 Encounters:  07/09/24 229 lb (103.9 kg)  08/22/23 233 lb (105.7 kg)  05/23/22 222 lb (100.7 kg)       Assessment & Plan:   Problem List Items Addressed This Visit   None Visit Diagnoses       Acute right-sided low back pain without sciatica    -  Primary   Relevant Medications   meloxicam  (MOBIC ) 15 MG tablet   Other Relevant Orders   POCT Urinalysis Dipstick (Automated) (Completed)   Urine Culture (Completed)   CBC with Differential/Platelet (Completed)   Comprehensive metabolic panel with GFR (Completed)   DG Lumbar  Spine 2-3 Views (Completed)     Leukocytes in urine       Relevant Orders   Urine Culture (Completed)   CBC with Differential/Platelet (Completed)   Comprehensive metabolic panel  with GFR (Completed)     History of kidney stones       Relevant Orders   DG Lumbar Spine 2-3 Views (Completed)       Assessment and Plan Assessment & Plan Low back pain Low back pain for ten days, located on the right side, described as aching and throbbing, worse in the morning and improving with movement. No radiation, numbness, or tingling. Multilevel degenerative changes at L5-S1 with moderate disc space height loss. Sharp pain at times but no radiation down the leg.  Differential diagnosis includes musculoskeletal pain versus kidney stone, but clinical presentation and examination suggest musculoskeletal origin. Declined Toradol injection. - Prescribe meloxicam  15 mg daily for 1-2 weeks. - Advise use of ice pack or heating pack as needed. - May take over-the-counter Tylenol if needed. - Recommend gentle stretches. - Advise to go to urgent care or the emergency department if new or worsening symptoms develop, such as fever, chills, nausea, vomiting, abdominal pain, difficulty urinating, or more severe back pain.  Urinary tract infection Urinary symptoms initially noted after recent COVID-19 infection, but have since resolved. Urinalysis shows leukocytes and nitrites, indicating a urinary tract infection. No current urinary symptoms, fever, chills, nausea, vomiting, or abdominal pain. - Prescribe Bactrim and send urine for culture.     I am having Jilberto Vanderwall. Berger start on sulfamethoxazole-trimethoprim. I am also having him maintain his aspirin, allopurinol , pravastatin , lisinopril -hydrochlorothiazide , and meloxicam .  Meds ordered this encounter  Medications   sulfamethoxazole-trimethoprim (BACTRIM DS) 800-160 MG tablet    Sig: Take 1 tablet by mouth 2 (two) times daily.    Dispense:  20 tablet     Refill:  0    Supervising Provider:   ROLLENE NORRIS A [[id number]]   meloxicam  (MOBIC ) 15 MG tablet    Sig: Take 1 tablet (15 mg total) by mouth daily.    Dispense:  30 tablet    Refill:  0    Supervising Provider:   ROLLENE NORRIS A [[id number]]

## 2024-07-09 NOTE — Patient Instructions (Signed)
 Please go downstairs for labs and an x-ray of your low back before you leave.  Take the anti-inflammatory and antibiotic as prescribed.  We will be in touch with your urine culture results.  Use a heating pad or ice pack and do some gentle stretches.  You can also take Tylenol 500 or 1000 mg twice daily if needed for pain. Avoid any other over-the-counter pain medications while taking meloxicam   If you develop fever, chills, worsening pain, nausea, vomiting or abdominal pain or difficulty urinating, please get immediate care at an urgent care or emergency department.

## 2024-07-11 LAB — URINE CULTURE

## 2024-07-11 NOTE — Progress Notes (Signed)
 His urine culture was positive for infection. Please ask him if he has ever seen a urologist. If so, he should follow up with them due to having the UTI. If he does not, he can wait and discuss with PCP at next appt or I can refer him to urology.

## 2024-08-13 ENCOUNTER — Inpatient Hospital Stay (HOSPITAL_COMMUNITY): Admitting: Anesthesiology

## 2024-08-13 ENCOUNTER — Other Ambulatory Visit: Payer: Self-pay

## 2024-08-13 ENCOUNTER — Encounter (HOSPITAL_COMMUNITY): Payer: Self-pay | Admitting: Emergency Medicine

## 2024-08-13 ENCOUNTER — Emergency Department (HOSPITAL_COMMUNITY)

## 2024-08-13 ENCOUNTER — Inpatient Hospital Stay (HOSPITAL_COMMUNITY)
Admission: EM | Admit: 2024-08-13 | Discharge: 2024-08-16 | DRG: 854 | Disposition: A | Discharge status: Home / Self Care | Attending: Internal Medicine | Admitting: Internal Medicine

## 2024-08-13 ENCOUNTER — Encounter (HOSPITAL_COMMUNITY): Admission: EM | Disposition: A | Payer: Self-pay | Source: Home / Self Care | Attending: Internal Medicine

## 2024-08-13 DIAGNOSIS — R7881 Bacteremia: Secondary | ICD-10-CM

## 2024-08-13 DIAGNOSIS — N1831 Chronic kidney disease, stage 3a: Secondary | ICD-10-CM | POA: Diagnosis not present

## 2024-08-13 DIAGNOSIS — Z720 Tobacco use: Secondary | ICD-10-CM

## 2024-08-13 DIAGNOSIS — Z808 Family history of malignant neoplasm of other organs or systems: Secondary | ICD-10-CM

## 2024-08-13 DIAGNOSIS — K828 Other specified diseases of gallbladder: Secondary | ICD-10-CM | POA: Diagnosis not present

## 2024-08-13 DIAGNOSIS — Z6836 Body mass index (BMI) 36.0-36.9, adult: Secondary | ICD-10-CM | POA: Diagnosis not present

## 2024-08-13 DIAGNOSIS — E669 Obesity, unspecified: Secondary | ICD-10-CM | POA: Diagnosis present

## 2024-08-13 DIAGNOSIS — B9620 Unspecified Escherichia coli [E. coli] as the cause of diseases classified elsewhere: Secondary | ICD-10-CM | POA: Diagnosis not present

## 2024-08-13 DIAGNOSIS — E785 Hyperlipidemia, unspecified: Secondary | ICD-10-CM | POA: Diagnosis present

## 2024-08-13 DIAGNOSIS — I129 Hypertensive chronic kidney disease with stage 1 through stage 4 chronic kidney disease, or unspecified chronic kidney disease: Secondary | ICD-10-CM | POA: Diagnosis present

## 2024-08-13 DIAGNOSIS — Z885 Allergy status to narcotic agent status: Secondary | ICD-10-CM | POA: Diagnosis not present

## 2024-08-13 DIAGNOSIS — N200 Calculus of kidney: Secondary | ICD-10-CM | POA: Diagnosis not present

## 2024-08-13 DIAGNOSIS — K810 Acute cholecystitis: Secondary | ICD-10-CM | POA: Diagnosis not present

## 2024-08-13 DIAGNOSIS — K819 Cholecystitis, unspecified: Secondary | ICD-10-CM

## 2024-08-13 DIAGNOSIS — Z87442 Personal history of urinary calculi: Secondary | ICD-10-CM

## 2024-08-13 DIAGNOSIS — N281 Cyst of kidney, acquired: Secondary | ICD-10-CM | POA: Diagnosis not present

## 2024-08-13 DIAGNOSIS — K82A1 Gangrene of gallbladder in cholecystitis: Secondary | ICD-10-CM | POA: Diagnosis not present

## 2024-08-13 DIAGNOSIS — Z791 Long term (current) use of non-steroidal anti-inflammatories (NSAID): Secondary | ICD-10-CM | POA: Diagnosis not present

## 2024-08-13 DIAGNOSIS — N3001 Acute cystitis with hematuria: Secondary | ICD-10-CM

## 2024-08-13 DIAGNOSIS — I10 Essential (primary) hypertension: Secondary | ICD-10-CM

## 2024-08-13 DIAGNOSIS — R0989 Other specified symptoms and signs involving the circulatory and respiratory systems: Secondary | ICD-10-CM | POA: Diagnosis not present

## 2024-08-13 DIAGNOSIS — Z79899 Other long term (current) drug therapy: Secondary | ICD-10-CM

## 2024-08-13 DIAGNOSIS — Z7982 Long term (current) use of aspirin: Secondary | ICD-10-CM | POA: Diagnosis not present

## 2024-08-13 DIAGNOSIS — A4151 Sepsis due to Escherichia coli [E. coli]: Principal | ICD-10-CM | POA: Diagnosis present

## 2024-08-13 DIAGNOSIS — R7989 Other specified abnormal findings of blood chemistry: Secondary | ICD-10-CM | POA: Insufficient documentation

## 2024-08-13 DIAGNOSIS — K8020 Calculus of gallbladder without cholecystitis without obstruction: Secondary | ICD-10-CM | POA: Diagnosis not present

## 2024-08-13 DIAGNOSIS — R0602 Shortness of breath: Secondary | ICD-10-CM | POA: Diagnosis not present

## 2024-08-13 DIAGNOSIS — K8000 Calculus of gallbladder with acute cholecystitis without obstruction: Secondary | ICD-10-CM | POA: Diagnosis not present

## 2024-08-13 DIAGNOSIS — N2889 Other specified disorders of kidney and ureter: Secondary | ICD-10-CM | POA: Diagnosis present

## 2024-08-13 DIAGNOSIS — Z9049 Acquired absence of other specified parts of digestive tract: Secondary | ICD-10-CM | POA: Diagnosis not present

## 2024-08-13 DIAGNOSIS — Z883 Allergy status to other anti-infective agents status: Secondary | ICD-10-CM

## 2024-08-13 DIAGNOSIS — Z811 Family history of alcohol abuse and dependence: Secondary | ICD-10-CM

## 2024-08-13 DIAGNOSIS — Z87440 Personal history of urinary (tract) infections: Secondary | ICD-10-CM | POA: Diagnosis not present

## 2024-08-13 DIAGNOSIS — R000 Tachycardia, unspecified: Secondary | ICD-10-CM | POA: Diagnosis present

## 2024-08-13 DIAGNOSIS — N261 Atrophy of kidney (terminal): Secondary | ICD-10-CM | POA: Diagnosis not present

## 2024-08-13 DIAGNOSIS — K8012 Calculus of gallbladder with acute and chronic cholecystitis without obstruction: Secondary | ICD-10-CM | POA: Diagnosis not present

## 2024-08-13 DIAGNOSIS — R8271 Bacteriuria: Secondary | ICD-10-CM | POA: Diagnosis present

## 2024-08-13 DIAGNOSIS — K838 Other specified diseases of biliary tract: Secondary | ICD-10-CM | POA: Diagnosis not present

## 2024-08-13 HISTORY — PX: CHOLECYSTECTOMY: SHX55

## 2024-08-13 LAB — CBC WITH DIFFERENTIAL/PLATELET
Abs Immature Granulocytes: 0.04 K/uL (ref 0.00–0.07)
Basophils Absolute: 0 K/uL (ref 0.0–0.1)
Basophils Relative: 0 %
Eosinophils Absolute: 0.1 K/uL (ref 0.0–0.5)
Eosinophils Relative: 1 %
HCT: 40.5 % (ref 39.0–52.0)
Hemoglobin: 12.3 g/dL — ABNORMAL LOW (ref 13.0–17.0)
Immature Granulocytes: 0 %
Lymphocytes Relative: 2 %
Lymphs Abs: 0.2 K/uL — ABNORMAL LOW (ref 0.7–4.0)
MCH: 28.3 pg (ref 26.0–34.0)
MCHC: 30.4 g/dL (ref 30.0–36.0)
MCV: 93.3 fL (ref 80.0–100.0)
Monocytes Absolute: 0 K/uL — ABNORMAL LOW (ref 0.1–1.0)
Monocytes Relative: 0 %
Neutro Abs: 10 K/uL — ABNORMAL HIGH (ref 1.7–7.7)
Neutrophils Relative %: 97 %
Platelets: 197 K/uL (ref 150–400)
RBC: 4.34 MIL/uL (ref 4.22–5.81)
RDW: 14.3 % (ref 11.5–15.5)
WBC: 10.3 K/uL (ref 4.0–10.5)
nRBC: 0 % (ref 0.0–0.2)

## 2024-08-13 LAB — URINALYSIS, W/ REFLEX TO CULTURE (INFECTION SUSPECTED)
Bilirubin Urine: NEGATIVE
Glucose, UA: NEGATIVE mg/dL
Ketones, ur: NEGATIVE mg/dL
Nitrite: POSITIVE — AB
Protein, ur: 30 mg/dL — AB
Specific Gravity, Urine: 1.016 (ref 1.005–1.030)
WBC, UA: >50 WBC/hpf (ref 0–5)
pH: 5 (ref 5.0–8.0)

## 2024-08-13 LAB — COMPREHENSIVE METABOLIC PANEL WITH GFR
ALT: 15 U/L (ref 0–44)
AST: 17 U/L (ref 15–41)
Albumin: 3.8 g/dL (ref 3.5–5.0)
Alkaline Phosphatase: 95 U/L (ref 38–126)
Anion gap: 11 (ref 5–15)
BUN: 26 mg/dL — ABNORMAL HIGH (ref 8–23)
CO2: 24 mmol/L (ref 22–32)
Calcium: 9.6 mg/dL (ref 8.9–10.3)
Chloride: 101 mmol/L (ref 98–111)
Creatinine, Ser: 2.09 mg/dL — ABNORMAL HIGH (ref 0.61–1.24)
GFR, Estimated: 34 mL/min — ABNORMAL LOW (ref >60)
Glucose, Bld: 117 mg/dL — ABNORMAL HIGH (ref 70–99)
Potassium: 3.9 mmol/L (ref 3.5–5.1)
Sodium: 136 mmol/L (ref 135–145)
Total Bilirubin: 1 mg/dL (ref 0.0–1.2)
Total Protein: 7.1 g/dL (ref 6.5–8.1)

## 2024-08-13 LAB — RESP PANEL BY RT-PCR (RSV, FLU A&B, COVID)  RVPGX2
Influenza A by PCR: NEGATIVE
Influenza B by PCR: NEGATIVE
Resp Syncytial Virus by PCR: NEGATIVE
SARS Coronavirus 2 by RT PCR: NEGATIVE

## 2024-08-13 LAB — PRO BRAIN NATRIURETIC PEPTIDE: Pro Brain Natriuretic Peptide: 234 pg/mL (ref <300.0)

## 2024-08-13 LAB — I-STAT CG4 LACTIC ACID, ED: Lactic Acid, Venous: 1.3 mmol/L (ref 0.5–1.9)

## 2024-08-13 LAB — TROPONIN T, HIGH SENSITIVITY
Troponin T High Sensitivity: 66 ng/L — ABNORMAL HIGH (ref 0–19)
Troponin T High Sensitivity: 63 ng/L — ABNORMAL HIGH (ref 0–19)

## 2024-08-13 SURGERY — LAPAROSCOPIC CHOLECYSTECTOMY
Anesthesia: General

## 2024-08-13 MED ORDER — SODIUM CHLORIDE 0.9 % IV BOLUS
1000.0000 mL | Freq: Once | INTRAVENOUS | Status: AC
  Administered 2024-08-13: 1000 mL via INTRAVENOUS

## 2024-08-13 MED ORDER — LIDOCAINE HCL (CARDIAC) PF 100 MG/5ML IV SOSY
PREFILLED_SYRINGE | INTRAVENOUS | Status: DC | PRN
  Administered 2024-08-13: 80 mg via INTRAVENOUS

## 2024-08-13 MED ORDER — OXYCODONE HCL 5 MG PO TABS
5.0000 mg | ORAL_TABLET | ORAL | Status: DC | PRN
  Administered 2024-08-14 – 2024-08-16 (×9): 5 mg via ORAL
  Filled 2024-08-13 (×9): qty 1

## 2024-08-13 MED ORDER — FENTANYL CITRATE (PF) 250 MCG/5ML IJ SOLN
INTRAMUSCULAR | Status: AC
  Filled 2024-08-13: qty 5

## 2024-08-13 MED ORDER — FENTANYL CITRATE (PF) 100 MCG/2ML IJ SOLN
INTRAMUSCULAR | Status: DC | PRN
  Administered 2024-08-13 (×2): 50 ug via INTRAVENOUS
  Administered 2024-08-13: 100 ug via INTRAVENOUS
  Administered 2024-08-13: 50 ug via INTRAVENOUS

## 2024-08-13 MED ORDER — BUPIVACAINE-EPINEPHRINE 0.25% -1:200000 IJ SOLN
INTRAMUSCULAR | Status: DC | PRN
Start: 2024-08-13 — End: 2024-08-13
  Administered 2024-08-13: 30 mL

## 2024-08-13 MED ORDER — SODIUM CHLORIDE 0.9 % IV SOLN
2.0000 g | Freq: Once | INTRAVENOUS | Status: AC
  Administered 2024-08-13: 2 g via INTRAVENOUS
  Filled 2024-08-13: qty 20

## 2024-08-13 MED ORDER — SUGAMMADEX SODIUM 200 MG/2ML IV SOLN
INTRAVENOUS | Status: AC
  Filled 2024-08-13: qty 4

## 2024-08-13 MED ORDER — OXYCODONE HCL 5 MG PO TABS: 5.0000 mg | ORAL_TABLET | Freq: Once | ORAL | Status: DC | PRN

## 2024-08-13 MED ORDER — LACTATED RINGERS IV SOLN: INTRAVENOUS | Status: DC | PRN

## 2024-08-13 MED ORDER — PROPOFOL 10 MG/ML IV BOLUS
INTRAVENOUS | Status: DC | PRN
  Administered 2024-08-13: 200 mg via INTRAVENOUS

## 2024-08-13 MED ORDER — SODIUM CHLORIDE 0.9 % IV BOLUS
500.0000 mL | Freq: Once | INTRAVENOUS | Status: AC
  Administered 2024-08-13: 500 mL via INTRAVENOUS

## 2024-08-13 MED ORDER — DEXAMETHASONE SODIUM PHOSPHATE 4 MG/ML IJ SOLN
INTRAMUSCULAR | Status: DC | PRN
  Administered 2024-08-13: 4 mg via INTRAVENOUS

## 2024-08-13 MED ORDER — STERILE WATER FOR IRRIGATION IR SOLN
Status: DC | PRN
  Administered 2024-08-13: 1000 mL

## 2024-08-13 MED ORDER — HYDROMORPHONE HCL 1 MG/ML IJ SOLN
0.5000 mg | INTRAMUSCULAR | Status: DC | PRN
  Administered 2024-08-13 – 2024-08-14 (×3): 1 mg via INTRAVENOUS
  Administered 2024-08-14: 0.5 mg via INTRAVENOUS
  Administered 2024-08-15 – 2024-08-16 (×2): 1 mg via INTRAVENOUS
  Filled 2024-08-13 (×6): qty 1

## 2024-08-13 MED ORDER — TRAZODONE HCL 50 MG PO TABS
25.0000 mg | ORAL_TABLET | Freq: Every evening | ORAL | Status: DC | PRN
Start: 2024-08-13 — End: 2024-08-16

## 2024-08-13 MED ORDER — ONDANSETRON HCL 4 MG/2ML IJ SOLN: 4.0000 mg | Freq: Four times a day (QID) | INTRAMUSCULAR | Status: DC | PRN

## 2024-08-13 MED ORDER — PHENYLEPHRINE HCL-NACL 20-0.9 MG/250ML-% IV SOLN
INTRAVENOUS | Status: DC | PRN
  Administered 2024-08-13: 35 ug/min via INTRAVENOUS

## 2024-08-13 MED ORDER — ACETAMINOPHEN 325 MG PO TABS
650.0000 mg | ORAL_TABLET | Freq: Once | ORAL | Status: AC
  Administered 2024-08-13: 650 mg via ORAL
  Filled 2024-08-13: qty 2

## 2024-08-13 MED ORDER — SODIUM CHLORIDE 0.9 % IV SOLN: 1.0000 g | INTRAVENOUS | Status: DC

## 2024-08-13 MED ORDER — PRAVASTATIN SODIUM 40 MG PO TABS
20.0000 mg | ORAL_TABLET | Freq: Every day | ORAL | Status: DC
  Administered 2024-08-14 – 2024-08-16 (×3): 20 mg via ORAL
  Filled 2024-08-13 (×3): qty 1

## 2024-08-13 MED ORDER — ROCURONIUM BROMIDE 100 MG/10ML IV SOLN
INTRAVENOUS | Status: DC | PRN
  Administered 2024-08-13: 50 mg via INTRAVENOUS

## 2024-08-13 MED ORDER — ONDANSETRON HCL 4 MG/2ML IJ SOLN: 4.0000 mg | Freq: Once | INTRAMUSCULAR | Status: DC | PRN

## 2024-08-13 MED ORDER — INDOCYANINE GREEN 25 MG IV SOLR
1.2500 mg | INTRAVENOUS | Status: AC
  Administered 2024-08-13: 1.25 mg via INTRAVENOUS

## 2024-08-13 MED ORDER — CHLORHEXIDINE GLUCONATE 0.12 % MT SOLN
15.0000 mL | Freq: Once | OROMUCOSAL | Status: AC
  Administered 2024-08-13: 15 mL via OROMUCOSAL

## 2024-08-13 MED ORDER — BUPIVACAINE-EPINEPHRINE (PF) 0.25% -1:200000 IJ SOLN
INTRAMUSCULAR | Status: AC
  Filled 2024-08-13: qty 30

## 2024-08-13 MED ORDER — ALBUTEROL SULFATE (2.5 MG/3ML) 0.083% IN NEBU
2.5000 mg | INHALATION_SOLUTION | RESPIRATORY_TRACT | Status: DC | PRN
Start: 2024-08-13 — End: 2024-08-16

## 2024-08-13 MED ORDER — ONDANSETRON HCL 4 MG/2ML IJ SOLN
INTRAMUSCULAR | Status: AC
  Filled 2024-08-13: qty 2

## 2024-08-13 MED ORDER — ONDANSETRON HCL 4 MG PO TABS: 4.0000 mg | ORAL_TABLET | Freq: Four times a day (QID) | ORAL | Status: DC | PRN

## 2024-08-13 MED ORDER — MUPIROCIN 2 % EX OINT: 1.0000 | TOPICAL_OINTMENT | Freq: Two times a day (BID) | CUTANEOUS | Status: DC

## 2024-08-13 MED ORDER — MIDAZOLAM HCL 5 MG/5ML IJ SOLN
INTRAMUSCULAR | Status: DC | PRN
  Administered 2024-08-13: 2 mg via INTRAVENOUS

## 2024-08-13 MED ORDER — SUGAMMADEX SODIUM 200 MG/2ML IV SOLN
INTRAVENOUS | Status: AC
  Filled 2024-08-13: qty 2

## 2024-08-13 MED ORDER — DEXMEDETOMIDINE HCL IN NACL 80 MCG/20ML IV SOLN
INTRAVENOUS | Status: DC | PRN
  Administered 2024-08-13: 12 ug via INTRAVENOUS
  Administered 2024-08-13: 8 ug via INTRAVENOUS

## 2024-08-13 MED ORDER — ONDANSETRON HCL 4 MG/2ML IJ SOLN
INTRAMUSCULAR | Status: DC | PRN
  Administered 2024-08-13: 4 mg via INTRAVENOUS

## 2024-08-13 MED ORDER — ACETAMINOPHEN 650 MG RE SUPP: 650.0000 mg | Freq: Four times a day (QID) | RECTAL | Status: DC | PRN

## 2024-08-13 MED ORDER — 0.9 % SODIUM CHLORIDE (POUR BTL) OPTIME
TOPICAL | Status: DC | PRN
  Administered 2024-08-13: 1000 mL

## 2024-08-13 MED ORDER — METRONIDAZOLE 500 MG/100ML IV SOLN
500.0000 mg | Freq: Once | INTRAVENOUS | Status: AC
  Administered 2024-08-13: 500 mg via INTRAVENOUS
  Filled 2024-08-13: qty 100

## 2024-08-13 MED ORDER — SODIUM CHLORIDE 0.9 % IV SOLN: INTRAVENOUS | Status: DC

## 2024-08-13 MED ORDER — MIDAZOLAM HCL 2 MG/2ML IJ SOLN
INTRAMUSCULAR | Status: AC
  Filled 2024-08-13: qty 2

## 2024-08-13 MED ORDER — METRONIDAZOLE 500 MG/100ML IV SOLN
500.0000 mg | Freq: Two times a day (BID) | INTRAVENOUS | Status: DC
  Administered 2024-08-13 – 2024-08-15 (×4): 500 mg via INTRAVENOUS
  Filled 2024-08-13 (×4): qty 100

## 2024-08-13 MED ORDER — FENTANYL CITRATE (PF) 50 MCG/ML IJ SOSY
50.0000 ug | PREFILLED_SYRINGE | Freq: Once | INTRAMUSCULAR | Status: AC
  Administered 2024-08-13: 50 ug via INTRAVENOUS
  Filled 2024-08-13: qty 1

## 2024-08-13 MED ORDER — SUGAMMADEX SODIUM 200 MG/2ML IV SOLN
INTRAVENOUS | Status: DC | PRN
  Administered 2024-08-13: 200 mg via INTRAVENOUS

## 2024-08-13 MED ORDER — OXYCODONE HCL 5 MG/5ML PO SOLN: 5.0000 mg | Freq: Once | ORAL | Status: DC | PRN

## 2024-08-13 MED ORDER — HYDROMORPHONE HCL 1 MG/ML IJ SOLN
0.2500 mg | INTRAMUSCULAR | Status: DC | PRN
  Administered 2024-08-13 (×2): 0.5 mg via INTRAVENOUS

## 2024-08-13 MED ORDER — ACETAMINOPHEN 325 MG PO TABS
650.0000 mg | ORAL_TABLET | Freq: Four times a day (QID) | ORAL | Status: DC | PRN
  Filled 2024-08-13: qty 2

## 2024-08-13 MED ORDER — HYDROMORPHONE HCL 1 MG/ML IJ SOLN
INTRAMUSCULAR | Status: AC
  Filled 2024-08-13: qty 1

## 2024-08-13 SURGICAL SUPPLY — 41 items
BAG COUNTER SPONGE SURGICOUNT (BAG) IMPLANT
BENZOIN TINCTURE PRP APPL 2/3 (GAUZE/BANDAGES/DRESSINGS) IMPLANT
BNDG ADH 1X3 SHEER STRL LF (GAUZE/BANDAGES/DRESSINGS) IMPLANT
CHLORAPREP W/TINT 26 (MISCELLANEOUS) ×1 IMPLANT
CLIP APPLIE 5 13 M/L LIGAMAX5 (MISCELLANEOUS) IMPLANT
CLIP APPLIE ROT 10 11.4 M/L (STAPLE) IMPLANT
CLIP LIGATING HEM O LOK PURPLE (MISCELLANEOUS) IMPLANT
CLIP LIGATING HEMO O LOK GREEN (MISCELLANEOUS) ×1 IMPLANT
COVER MAYO STAND XLG (MISCELLANEOUS) ×1 IMPLANT
COVER SURGICAL LIGHT HANDLE (MISCELLANEOUS) ×1 IMPLANT
DERMABOND ADVANCED .7 DNX12 (GAUZE/BANDAGES/DRESSINGS) IMPLANT
DRAIN CHANNEL 19F RND (DRAIN) IMPLANT
DRAPE C-ARM 42X120 X-RAY (DRAPES) IMPLANT
ELECT REM PT RETURN 15FT ADLT (MISCELLANEOUS) ×1 IMPLANT
ENDOLOOP SUT PDS II 0 18 (SUTURE) IMPLANT
EVACUATOR SILICONE 100CC (DRAIN) IMPLANT
GLOVE BIOGEL PI IND STRL 7.0 (GLOVE) ×1 IMPLANT
GLOVE SURG SS PI 7.0 STRL IVOR (GLOVE) ×1 IMPLANT
GOWN STRL REUS W/ TWL LRG LVL3 (GOWN DISPOSABLE) ×1 IMPLANT
GRASPER SUT TROCAR 14GX15 (MISCELLANEOUS) IMPLANT
HEMOSTAT SNOW SURGICEL 2X4 (HEMOSTASIS) IMPLANT
IRRIGATION SUCT STRKRFLW 2 WTP (MISCELLANEOUS) ×1 IMPLANT
KIT BASIN OR (CUSTOM PROCEDURE TRAY) ×1 IMPLANT
KIT TURNOVER KIT A (KITS) ×1 IMPLANT
NDL HYPO 22X1.5 SAFETY MO (MISCELLANEOUS) ×1 IMPLANT
NEEDLE HYPO 22X1.5 SAFETY MO (MISCELLANEOUS) ×1 IMPLANT
POUCH RETRIEVAL ECOSAC 10 (ENDOMECHANICALS) ×1 IMPLANT
SCISSORS LAP 5X35 DISP (ENDOMECHANICALS) ×1 IMPLANT
SET CHOLANGIOGRAPH MIX (MISCELLANEOUS) IMPLANT
SET TUBE SMOKE EVAC HIGH FLOW (TUBING) ×1 IMPLANT
SLEEVE Z-THREAD 5X100MM (TROCAR) ×2 IMPLANT
SPIKE FLUID TRANSFER (MISCELLANEOUS) ×1 IMPLANT
STRIP CLOSURE SKIN 1/2X4 (GAUZE/BANDAGES/DRESSINGS) IMPLANT
SUT ETHILON 2 0 PS N (SUTURE) IMPLANT
SUT MNCRL AB 4-0 PS2 18 (SUTURE) ×1 IMPLANT
SUT VICRYL 0 UR6 27IN ABS (SUTURE) ×1 IMPLANT
TOWEL OR 17X26 10 PK STRL BLUE (TOWEL DISPOSABLE) ×1 IMPLANT
TRAY LAPAROSCOPIC (CUSTOM PROCEDURE TRAY) ×1 IMPLANT
TROCAR ADV FIXATION 12X100MM (TROCAR) IMPLANT
TROCAR Z THREAD OPTICAL 12X100 (TROCAR) ×1 IMPLANT
TROCAR Z-THREAD OPTICAL 5X100M (TROCAR) ×1 IMPLANT

## 2024-08-13 NOTE — Consult Note (Signed)
 Terry Cummings 12-09-1960  [id number].    Requesting MD: Olam Dimes, PA-C Chief Complaint/Reason for Consult: cholecystitis   HPI:  Terry Cummings is a 63 y/o M with a past medical history of obesity, hypertension, and gout who presents with worsening abdominal pain.  Reports right sided abdominal pain that started 3 days ago and has remained constant.  Denies aggravating or alleviating factors.  Denies similar pain in the past.  Associated symptoms include fever, chills, body shakes.  Denies nausea, vomiting, decreased p.o. intake, changes in bowel habits.  Denies tobacco, alcohol, or drug use.  Takes a baby aspirin, denies the use of any other blood thinners.  Denies a history of abdominal surgery. He works as a pension scheme manager.  His wife is at the bedside.  Denies a history of MI, stroke, pulmonary issues.   ROS: Review of Systems  All other systems reviewed and are negative.   Family History  Problem Relation Age of Onset   Alcohol abuse Father    Cirrhosis Father    Brain cancer Father     Past Medical History:  Diagnosis Date   Atypical nevus 07/26/2003   Upper Center Back-Slight to Moderate (w/s)   Backache, unspecified    Hypertension    Obesity, unspecified    Other and unspecified hyperlipidemia    Pain in joint, lower leg    Personal history of urinary calculi     Past Surgical History:  Procedure Laterality Date   excision of prepatellar burs     IRRIGATION AND DEBRIDEMENT, open, of infected prepatellar bursa     TONSILLECTOMY      Social History:  reports that he has never smoked. His smokeless tobacco use includes chew. He reports that he does not drink alcohol and does not use drugs.  Allergies:  Allergies  Allergen Reactions   Codeine     REACTION: nausea   Neomycin-Bacitracin Zn-Polymyx     REACTION: rash/itching    Medications Prior to Admission  Medication Sig Dispense Refill   allopurinol  (ZYLOPRIM ) 300 MG tablet Take 1  tablet (300 mg total) by mouth daily. 90 tablet 3   aspirin 81 MG tablet Take 81 mg by mouth daily.     lisinopril -hydrochlorothiazide  (ZESTORETIC ) 20-12.5 MG tablet TAKE 1 TABLET BY MOUTH DAILY 90 tablet 0   meloxicam  (MOBIC ) 15 MG tablet Take 1 tablet (15 mg total) by mouth daily. 30 tablet 0   pravastatin  (PRAVACHOL ) 20 MG tablet TAKE 1 TABLET BY MOUTH DAILY 90 tablet 0   sulfamethoxazole-trimethoprim (BACTRIM DS) 800-160 MG tablet Take 1 tablet by mouth 2 (two) times daily. 20 tablet 0     Physical Exam: Blood pressure 125/79, pulse (!) 118, temperature 99.9 F (37.7 C), temperature source Oral, resp. rate (!) 23, SpO2 98%. General: cooperative white male, laying on hospital bed, appears stated age, NAD. HEENT: head -normocephalic, atraumatic; Eyes: PERRLA, no conjunctival injection;anicteric sclerae Throat: pink mucosa, uvula midline, no exudates.  Neck- Trachea is midline CV- sinus tachycardia, no lower extremity edema  Pulm- breathing is non-labored Abd- soft, TTP RUQ with involuntary guarding, no peritonitis, no hernias or masses GU- deferred  MSK- UE/LE symmetrical, no cyanosis, clubbing, or edema. Neuro- CN II-XII grossly in tact, no paresthesias. Psych- Alert and Oriented x3 with appropriate affect Skin: warm and dry, no rashes or lesions   Results for orders placed or performed during the hospital encounter of 08/13/24 (from the past 48 hours)  CBC with Differential  Status: Abnormal   Collection Time: 08/13/24  4:45 AM  Result Value Ref Range   WBC 10.3 4.0 - 10.5 K/uL   RBC 4.34 4.22 - 5.81 MIL/uL   Hemoglobin 12.3 (L) 13.0 - 17.0 g/dL   HCT 59.4 60.9 - 47.9 %   MCV 93.3 80.0 - 100.0 fL   MCH 28.3 26.0 - 34.0 pg   MCHC 30.4 30.0 - 36.0 g/dL   RDW 85.6 88.4 - 84.4 %   Platelets 197 150 - 400 K/uL   nRBC 0.0 0.0 - 0.2 %   Neutrophils Relative % 97 %   Neutro Abs 10.0 (H) 1.7 - 7.7 K/uL   Lymphocytes Relative 2 %   Lymphs Abs 0.2 (L) 0.7 - 4.0 K/uL    Monocytes Relative 0 %   Monocytes Absolute 0.0 (L) 0.1 - 1.0 K/uL   Eosinophils Relative 1 %   Eosinophils Absolute 0.1 0.0 - 0.5 K/uL   Basophils Relative 0 %   Basophils Absolute 0.0 0.0 - 0.1 K/uL   Immature Granulocytes 0 %   Abs Immature Granulocytes 0.04 0.00 - 0.07 K/uL    Comment: Performed at [facility], [address], [place], [place] [address]  Comprehensive metabolic panel     Status: Abnormal   Collection Time: 08/13/24  4:45 AM  Result Value Ref Range   Sodium 136 135 - 145 mmol/L   Potassium 3.9 3.5 - 5.1 mmol/L   Chloride 101 98 - 111 mmol/L   CO2 24 22 - 32 mmol/L   Glucose, Bld 117 (H) 70 - 99 mg/dL    Comment: Glucose reference range applies only to samples taken after fasting for at least 8 hours.   BUN 26 (H) 8 - 23 mg/dL   Creatinine, Ser 7.90 (H) 0.61 - 1.24 mg/dL   Calcium 9.6 8.9 - 89.6 mg/dL   Total Protein 7.1 6.5 - 8.1 g/dL   Albumin 3.8 3.5 - 5.0 g/dL   AST 17 15 - 41 U/L   ALT 15 0 - 44 U/L   Alkaline Phosphatase 95 38 - 126 U/L   Total Bilirubin 1.0 0.0 - 1.2 mg/dL   GFR, Estimated 34 (L) >60 mL/min    Comment: (NOTE) Calculated using the CKD-EPI Creatinine Equation (2021)    Anion gap 11 5 - 15    Comment: Performed at [facility], [address] [place], [place] [address]  Urinalysis, w/ Reflex to Culture (Infection Suspected) -Urine, Clean Catch     Status: Abnormal   Collection Time: 08/13/24  4:45 AM  Result Value Ref Range   Specimen Source URINE, CLEAN CATCH    Color, Urine YELLOW YELLOW   APPearance HAZY (A) CLEAR   Specific Gravity, Urine 1.016 1.005 - 1.030   pH 5.0 5.0 - 8.0   Glucose, UA NEGATIVE NEGATIVE mg/dL   Hgb urine dipstick SMALL (A) NEGATIVE   Bilirubin Urine NEGATIVE NEGATIVE   Ketones, ur NEGATIVE NEGATIVE mg/dL   Protein, ur 30 (A) NEGATIVE mg/dL   Nitrite POSITIVE (A) NEGATIVE   Leukocytes,Ua MODERATE (A) NEGATIVE   RBC / HPF 0-5 0 - 5 RBC/hpf   WBC, UA >50 0 - 5  WBC/hpf    Comment:        Reflex urine culture not performed if WBC <=10, OR if Squamous epithelial cells >5. If Squamous epithelial cells >5 suggest recollection.    Bacteria, UA MANY (A) NONE SEEN   Squamous Epithelial / HPF 0-5 0 -  5 /HPF   Mucus PRESENT    Hyaline Casts, UA PRESENT     Comment: Performed at [facility], [address] [place], [place] [address]  Resp panel by RT-PCR (RSV, Flu A&B, Covid) Anterior Nasal Swab     Status: None   Collection Time: 08/13/24  4:45 AM   Specimen: Anterior Nasal Swab  Result Value Ref Range   SARS Coronavirus 2 by RT PCR NEGATIVE NEGATIVE    Comment: (NOTE) SARS-CoV-2 target nucleic acids are NOT DETECTED.  The SARS-CoV-2 RNA is generally detectable in upper respiratory specimens during the acute phase of infection. The lowest concentration of SARS-CoV-2 viral copies this assay can detect is 138 copies/mL. A negative result does not preclude SARS-Cov-2 infection and should not be used as the sole basis for treatment or other patient management decisions. A negative result may occur with  improper specimen collection/handling, submission of specimen other than nasopharyngeal swab, presence of viral mutation(s) within the areas targeted by this assay, and inadequate number of viral copies(<138 copies/mL). A negative result must be combined with clinical observations, patient history, and epidemiological information. The expected result is Negative.  Fact Sheet for Patients:  bloggercourse.com  Fact Sheet for Healthcare Providers:  seriousbroker.it  This test is no t yet approved or cleared by the [place]  FDA and  has been authorized for detection and/or diagnosis of SARS-CoV-2 by FDA under an Emergency Use Authorization (EUA). This EUA will remain  in effect (meaning this test can be used) for the duration of the COVID-19 declaration under Section  564(b)(1) of the Act, 21 U.S.C.section 360bbb-3(b)(1), unless the authorization is terminated  or revoked sooner.       Influenza A by PCR NEGATIVE NEGATIVE   Influenza B by PCR NEGATIVE NEGATIVE    Comment: (NOTE) The Xpert Xpress SARS-CoV-2/FLU/RSV plus assay is intended as an aid in the diagnosis of influenza from Nasopharyngeal swab specimens and should not be used as a sole basis for treatment. Nasal washings and aspirates are unacceptable for Xpert Xpress SARS-CoV-2/FLU/RSV testing.  Fact Sheet for Patients: bloggercourse.com  Fact Sheet for Healthcare Providers: seriousbroker.it  This test is not yet approved or cleared by the [place]  FDA and has been authorized for detection and/or diagnosis of SARS-CoV-2 by FDA under an Emergency Use Authorization (EUA). This EUA will remain in effect (meaning this test can be used) for the duration of the COVID-19 declaration under Section 564(b)(1) of the Act, 21 U.S.C. section 360bbb-3(b)(1), unless the authorization is terminated or revoked.     Resp Syncytial Virus by PCR NEGATIVE NEGATIVE    Comment: (NOTE) Fact Sheet for Patients: bloggercourse.com  Fact Sheet for Healthcare Providers: seriousbroker.it  This test is not yet approved or cleared by the [place]  FDA and has been authorized for detection and/or diagnosis of SARS-CoV-2 by FDA under an Emergency Use Authorization (EUA). This EUA will remain in effect (meaning this test can be used) for the duration of the COVID-19 declaration under Section 564(b)(1) of the Act, 21 U.S.C. section 360bbb-3(b)(1), unless the authorization is terminated or revoked.  Performed at [facility], [address] [place], [place] [address]   Blood culture (routine x 2)     Status: None (Preliminary result)   Collection Time: 08/13/24  4:45 AM   Specimen:  BLOOD  Result Value Ref Range   Specimen Description      BLOOD BLOOD RIGHT ARM Performed at [facility], [address]  [place], [place] [address]    Special Requests      Blood Culture adequate volume BOTTLES DRAWN AEROBIC AND ANAEROBIC Performed at [facility], [address] [place], [place] [address]    Culture      NO GROWTH <12 HOURS Performed at [facility], [address], [place], [place] [address]    Report Status PENDING   Blood culture (routine x 2)     Status: None (Preliminary result)   Collection Time: 08/13/24  4:45 AM   Specimen: BLOOD  Result Value Ref Range   Specimen Description      BLOOD BLOOD LEFT FOREARM Performed at [facility], [address], [place], [place] [address]    Special Requests      Blood Culture adequate volume BOTTLES DRAWN AEROBIC ONLY Performed at [facility], [address] [place], [place] [address]    Culture      NO GROWTH <12 HOURS Performed at [facility], [address], [place], [place] [address]    Report Status PENDING   Troponin T, High Sensitivity     Status: Abnormal   Collection Time: 08/13/24  4:45 AM  Result Value Ref Range   Troponin T High Sensitivity 66 (H) 0 - 19 ng/L    Comment: (NOTE) Biotin concentrations > 1000 ng/mL falsely decrease TnT results.  Serial cardiac troponin measurements are suggested.  Refer to the Links section for chest pain algorithms and additional  guidance. Performed at [facility], [address] [place], [place] [address]   Pro Brain natriuretic peptide     Status: None   Collection Time: 08/13/24  4:45 AM  Result Value Ref Range   Pro Brain Natriuretic Peptide 234.0 <300.0 pg/mL    Comment: (NOTE) Age Group        Cut-Points    Interpretation  < 50 years     450 pg/mL       NT-proBNP > 450 pg/mL indicates                                ADHF is likely               50 to 75 years  900 pg/mL      NT-proBNP > 900 pg/mL indicates          ADHF is likely  > 75 years      1800 pg/mL     NT-proBNP > 1800 pg/mL indicates          ADHF is likely                           All ages    Results between       Indeterminate. Further clinical             300 and the cut-   information is needed to determine            point for age group   if ADHF is present.  Elecsys proBNP II/ Elecsys proBNP II STAT           Cut-Point                       Interpretation  300 pg/mL                    NT-proBNP <300pg/mL indicates                             ADHF is not likely  Performed at [facility], [address] [place], [place] [address]   I-Stat CG4 Lactic Acid     Status: None   Collection Time: 08/13/24  5:06 AM  Result Value Ref Range   Lactic Acid, Venous 1.3 0.5 - 1.9 mmol/L   CT Renal Stone Study Result Date: 08/13/2024 EXAM: CT UROGRAM 08/13/2024 06:25:00 AM TECHNIQUE: CT of the abdomen and pelvis was performed without the administration of intravenous contrast as per CT urogram protocol. Multiplanar reformatted images as well as MIP urogram images are provided for review. Automated exposure control, iterative reconstruction, and/or weight based adjustment of the mA/kV was utilized to reduce the radiation dose to as low as reasonably achievable. COMPARISON: 03/23/2006 CLINICAL HISTORY: FINDINGS: LOWER CHEST: Dependent changes noted in the posterior lung bases. Lingular and posterior right lower lobe scarring. LIVER: The liver is unremarkable. GALLBLADDER AND BILE DUCTS: Multiple tiny stones and sludge noted within the gallbladder. Gallbladder wall appears mildly thickened with diffuse pericholecystic soft tissue stranding. No biliary ductal dilatation. SPLEEN: The spleen is within normal limits in size and appearance. PANCREAS: The pancreas is normal in size and contour without  focal lesion or ductal dilatation. ADRENAL GLANDS: No acute abnormality. KIDNEYS, URETERS AND BLADDER: Bilateral renal cortical scarring with progressive right renal cortical atrophy. Punctate stone noted within the interpolar right kidney, image 38/2. There are multiple indeterminate lesions arising off the right kidney. Index lesion off the upper pole measures 3.2 x 2.7 cm and 23 Hounsfield units. Off the inferior pole of the right kidney, there is a 2.6 x 2.3 cm exophytic lesion measuring 30 Hounsfield units. No stones in the ureters. No hydronephrosis. No perinephric or periureteral stranding. A 2.4 cm diverticula arises off the right posterior bladder wall, image 77/2. GI AND BOWEL: Stomach demonstrates no acute abnormality. The appendix is visualized and normal in caliber, without wall thickening, periappendiceal inflammation, or fluid. Sigmoid colon demonstrates diverticulosis without evidence of diverticulitis. No bowel wall thickening, pericolonic stranding, abscess, or free air. There is no bowel obstruction. PERITONEUM AND RETROPERITONEUM: No ascites. No free air. Trace fluid within the right posterior pelvis. VASCULATURE: Aorta is normal in caliber. Aortic atherosclerotic calcifications. LYMPH NODES: No lymphadenopathy. REPRODUCTIVE ORGANS: Normal prostate gland. BONES AND SOFT TISSUES: Multilevel lumbar spondylosis. Bilateral L5 pars defects with first degree anterolisthesis of L5 on S1. No focal soft tissue abnormality. IMPRESSION: 1. Findings suspicious for acute cholecystitis given gallbladder calculi/sludge with mild wall thickening and pericholecystic stranding. Surgical consultation is recommended. 2. Indeterminate right renal masses measuring 3.2 x 2.7 cm (23 HU) at the upper pole and 2.6 x 2.3 cm (30 HU) at the inferior pole. Recommend Non-emergent renal protocol MRI or CT without and with contrast for characterization. 3. Right renal interpolar punctate nephrolithiasis. 4. Right posterior  bladder wall diverticulum measuring 2.4 cm. 5. Aortic atherosclerotic calcifications. Electronically signed by: Waddell Calk MD 08/13/2024 06:36 AM EST RP Workstation: [id number]  DG Chest Port 1 View Result Date: 08/13/2024 EXAM: 1 VIEW(S) XRAY OF THE CHEST 08/13/2024 04:58:00 AM COMPARISON: Chest radiographs 02/14/2012. CLINICAL HISTORY: 63 year old male. SOB. FINDINGS: LUNGS AND PLEURA: Low lung volumes, similar to the prior. When allowing for portable technique, the lungs appear clear. No focal pulmonary opacity. No pulmonary edema. No pleural effusion. No pneumothorax. HEART AND MEDIASTINUM: No acute abnormality of the cardiac and mediastinal silhouettes. BONES AND SOFT TISSUES: No acute osseous abnormality. IMPRESSION: 1. No acute cardiopulmonary abnormality. Electronically signed by: Helayne Hurst MD 08/13/2024 05:42 AM EST RP Workstation: [id number]    Assessment/Plan Acute calculous cholecystitis Patient's history, labs, imaging have all been personally reviewed by me.  He is hemodynamically stable with sinus tachycardia 120 bpm during my exam.  CT scan of the abdomen without contrast showing gallbladder distention with mild wall thickening, Perry cholecystic stranding, and gallstones.  WBC is 10.  LFTs are within normal limits.  Evidence of AKI versus CKD with a creatinine of 2.09, compared to 1.79  33-month ago.  UA also suggestive of urinary tract infection.   The operative and non-operative management of cholecystitis was discussed with the patient. Risks of surgery including bleeding, infection, damage to surrounding structures, conversion to open, drain placement, need for additional procedures, prolonged hospital stay, as well as the risks of general anesthesia were discussed with the patient and he would like to proceed with surgery. Questions were welcomed and answered.    FEN - NPO, IVF per primary VTE - SCD's ID - Rocephin Admit - TRH service for mgmt medical co-morbidities    I  reviewed nursing notes, ED provider notes, last 24 h vitals and pain scores, last 48 h intake and output, last 24 h labs and trends, and last 24 h imaging results.  Almarie GORMAN Pringle, [facility] Surgery 08/13/2024, 8:09 AM Please see Amion for pager number during day hours 7:00am-4:30pm or 7:00am -11:30am on weekends

## 2024-08-13 NOTE — Anesthesia Preprocedure Evaluation (Addendum)
 Anesthesia Evaluation  Patient identified by MRN, date of birth, ID band Patient awake    Reviewed: Allergy & Precautions, H&P , NPO status , Patient's Chart, lab work & pertinent test results  History of Anesthesia Complications Negative for: history of anesthetic complications  Airway Mallampati: II  TM Distance: >3 FB Neck ROM: Full    Dental  (+) Edentulous Upper, Edentulous Lower   Pulmonary neg pulmonary ROS   Pulmonary exam normal breath sounds clear to auscultation       Cardiovascular hypertension, Pt. on medications Normal cardiovascular exam Rhythm:Regular Rate:Normal  Elevated troponin-likely due to persistent tachycardia related to his abdominal pain, patient denies any chest pain and EKG is nonacute- trending trops   Neuro/Psych neg Seizures negative neurological ROS  negative psych ROS   GI/Hepatic negative GI ROS, Neg liver ROS,,,admitted to the hospital with concern for UTI and acute cholecystitis.  Patient states he was treated for a UTI as an outpatient about 3 weeks ago, symptoms completely resolved.  He was feeling fine until about 3 days ago when he was having vague intermittent right upper quadrant abdominal pain- acute cholecystitis on imaging   Endo/Other    Renal/GU Renal InsufficiencyRenal disease (cr 2.09)  negative genitourinary   Musculoskeletal negative musculoskeletal ROS (+)    Abdominal  (+) + obese  Peds negative pediatric ROS (+)  Hematology   Anesthesia Other Findings   Reproductive/Obstetrics negative OB ROS                              Anesthesia Physical Anesthesia Plan  ASA: 3  Anesthesia Plan: General   Post-op Pain Management:    Induction: Intravenous  PONV Risk Score and Plan: 3 and Ondansetron, Dexamethasone, Midazolam and Treatment may vary due to age or medical condition  Airway Management Planned: Oral ETT  Additional Equipment:  None  Intra-op Plan:   Post-operative Plan: Extubation in OR  Informed Consent: I have reviewed the patients History and Physical, chart, labs and discussed the procedure including the risks, benefits and alternatives for the proposed anesthesia with the patient or authorized representative who has indicated his/her understanding and acceptance.     Dental advisory given  Plan Discussed with: CRNA  Anesthesia Plan Comments:          Anesthesia Quick Evaluation

## 2024-08-13 NOTE — Discharge Instructions (Signed)

## 2024-08-13 NOTE — ED Triage Notes (Signed)
 BIBA per EMS: pt arrives w/ c/o [facility] & tachycardia since 2am. Feels warm to touch. Pt also c/o tremors.

## 2024-08-13 NOTE — ED Provider Notes (Signed)
 Accepted handoff at shift change from [name] PA-C. Please see prior provider note for more detail.   Briefly: Patient is 63 y.o.   DDX: concern for UTI, cholecystitis, surgery to see in AM team, needs medical admission -- no chest pain. Suspect troponin is 2/2 to demand. Was having rigors, tachypnea. UTI and cholecystitis, heart rate is improving with fluids.  Plan: Spoke with the hospitalist, Dr. Zella who agrees to admission for acute cystitis, acute cholecystitis with plan for surgery to evaluate the patient at bedside for presumed cholecystectomy.    Terry Cummings 08/13/24 [id number]    [name], MD 08/13/24 [id number]

## 2024-08-13 NOTE — H&P (Signed)
 History and Physical  Terry Cummings:[id number] DOB: 08-Dec-1960 DOA: 08/13/2024  PCP: Rollene Almarie LABOR, MD   Chief Complaint: Shortness of breath, tachycardic, tremors, abdominal pain  HPI: Terry Cummings is a 63 y.o. male with medical history significant for gout, hypertension, hyperlipidemia, obesity being admitted to the hospital with concern for UTI and acute cholecystitis.  Patient states he was treated for a UTI as an outpatient about 3 weeks ago, symptoms completely resolved.  He was feeling fine until about 3 days ago when he was having vague intermittent right upper quadrant abdominal pain.  Early this morning about 2 AM he woke up from sleep with diffuse abdominal discomfort, subjective chills and tremors, feeling shortness of breath.  Currently has constant right sided abdominal pain, no aggravating or alleviating factors.  Has never had similar pain in the past, no changes in bowel or bladder habits.  Review of Systems: Please see HPI for pertinent positives and negatives. A complete 10 system review of systems are otherwise negative.  Past Medical History:  Diagnosis Date   Atypical nevus 07/26/2003   Upper Center Back-Slight to Moderate (w/s)   Backache, unspecified    Hypertension    Obesity, unspecified    Other and unspecified hyperlipidemia    Pain in joint, lower leg    Personal history of urinary calculi    Past Surgical History:  Procedure Laterality Date   excision of prepatellar burs     IRRIGATION AND DEBRIDEMENT, open, of infected prepatellar bursa     TONSILLECTOMY     Social History:  reports that he has never smoked. His smokeless tobacco use includes chew. He reports that he does not drink alcohol and does not use drugs.  Allergies  Allergen Reactions   Codeine     REACTION: nausea   Neomycin-Bacitracin Zn-Polymyx     REACTION: rash/itching    Family History  Problem Relation Age of Onset   Alcohol abuse Father    Cirrhosis Father     Brain cancer Father      Prior to Admission medications   Medication Sig Start Date End Date Taking? Authorizing Provider  allopurinol  (ZYLOPRIM ) 300 MG tablet Take 1 tablet (300 mg total) by mouth daily. 09/08/23   Rollene Almarie LABOR, MD  aspirin 81 MG tablet Take 81 mg by mouth daily.    [provider]  lisinopril -hydrochlorothiazide  (ZESTORETIC ) 20-12.5 MG tablet TAKE 1 TABLET BY MOUTH DAILY 06/09/24   Rollene Almarie LABOR, MD  meloxicam  (MOBIC ) 15 MG tablet Take 1 tablet (15 mg total) by mouth daily. 07/09/24   Henson, Vickie L, NP-C  pravastatin  (PRAVACHOL ) 20 MG tablet TAKE 1 TABLET BY MOUTH DAILY 06/09/24   Rollene Almarie LABOR, MD  sulfamethoxazole-trimethoprim (BACTRIM DS) 800-160 MG tablet Take 1 tablet by mouth 2 (two) times daily. 07/09/24   Lendia Boby CROME, NP-C    Physical Exam: BP 102/74 (BP Location: Right Arm)   Pulse (!) 113   Temp 98 F (36.7 C)   Resp (!) 22   SpO2 97%  General:  Alert, oriented, calm, in no acute distress, resting comfortably no family at the bedside Eyes: EOMI, clear conjuctivae, white sclerea Neck: supple, no masses, trachea mildline  Cardiovascular: RRR, no murmurs or rubs, no peripheral edema  Respiratory: clear to auscultation bilaterally, no wheezes, no crackles  Abdomen: soft, tender, mildly distended, normal bowel tones heard  Skin: dry, no rashes  Musculoskeletal: no joint effusions, normal range of motion  Psychiatric: appropriate affect, normal  speech  Neurologic: extraocular muscles intact, clear speech, moving all extremities with intact sensorium         Labs on Admission:  Basic Metabolic Panel: Recent Labs  Lab 08/13/24 0445  NA 136  K 3.9  CL 101  CO2 24  GLUCOSE 117*  BUN 26*  CREATININE 2.09*  CALCIUM 9.6   Liver Function Tests: Recent Labs  Lab 08/13/24 0445  AST 17  ALT 15  ALKPHOS 95  BILITOT 1.0  PROT 7.1  ALBUMIN 3.8   No results for input(s): LIPASE, AMYLASE in the last 168 hours. No  results for input(s): AMMONIA in the last 168 hours. CBC: Recent Labs  Lab 08/13/24 0445  WBC 10.3  NEUTROABS 10.0*  HGB 12.3*  HCT 40.5  MCV 93.3  PLT 197   Cardiac Enzymes: No results for input(s): CKTOTAL, CKMB, CKMBINDEX, TROPONINI in the last 168 hours. BNP (last 3 results) No results for input(s): BNP in the last 8760 hours.  ProBNP (last 3 results) Recent Labs    08/13/24 0445  PROBNP 234.0    CBG: No results for input(s): GLUCAP in the last 168 hours.  Radiological Exams on Admission: CT Renal Stone Study Result Date: 08/13/2024 EXAM: CT UROGRAM 08/13/2024 06:25:00 AM TECHNIQUE: CT of the abdomen and pelvis was performed without the administration of intravenous contrast as per CT urogram protocol. Multiplanar reformatted images as well as MIP urogram images are provided for review. Automated exposure control, iterative reconstruction, and/or weight based adjustment of the mA/kV was utilized to reduce the radiation dose to as low as reasonably achievable. COMPARISON: 03/23/2006 CLINICAL HISTORY: FINDINGS: LOWER CHEST: Dependent changes noted in the posterior lung bases. Lingular and posterior right lower lobe scarring. LIVER: The liver is unremarkable. GALLBLADDER AND BILE DUCTS: Multiple tiny stones and sludge noted within the gallbladder. Gallbladder wall appears mildly thickened with diffuse pericholecystic soft tissue stranding. No biliary ductal dilatation. SPLEEN: The spleen is within normal limits in size and appearance. PANCREAS: The pancreas is normal in size and contour without focal lesion or ductal dilatation. ADRENAL GLANDS: No acute abnormality. KIDNEYS, URETERS AND BLADDER: Bilateral renal cortical scarring with progressive right renal cortical atrophy. Punctate stone noted within the interpolar right kidney, image 38/2. There are multiple indeterminate lesions arising off the right kidney. Index lesion off the upper pole measures 3.2 x 2.7 cm and 23  Hounsfield units. Off the inferior pole of the right kidney, there is a 2.6 x 2.3 cm exophytic lesion measuring 30 Hounsfield units. No stones in the ureters. No hydronephrosis. No perinephric or periureteral stranding. A 2.4 cm diverticula arises off the right posterior bladder wall, image 77/2. GI AND BOWEL: Stomach demonstrates no acute abnormality. The appendix is visualized and normal in caliber, without wall thickening, periappendiceal inflammation, or fluid. Sigmoid colon demonstrates diverticulosis without evidence of diverticulitis. No bowel wall thickening, pericolonic stranding, abscess, or free air. There is no bowel obstruction. PERITONEUM AND RETROPERITONEUM: No ascites. No free air. Trace fluid within the right posterior pelvis. VASCULATURE: Aorta is normal in caliber. Aortic atherosclerotic calcifications. LYMPH NODES: No lymphadenopathy. REPRODUCTIVE ORGANS: Normal prostate gland. BONES AND SOFT TISSUES: Multilevel lumbar spondylosis. Bilateral L5 pars defects with first degree anterolisthesis of L5 on S1. No focal soft tissue abnormality. IMPRESSION: 1. Findings suspicious for acute cholecystitis given gallbladder calculi/sludge with mild wall thickening and pericholecystic stranding. Surgical consultation is recommended. 2. Indeterminate right renal masses measuring 3.2 x 2.7 cm (23 HU) at the upper pole and 2.6 x 2.3 cm (  30 HU) at the inferior pole. Recommend Non-emergent renal protocol MRI or CT without and with contrast for characterization. 3. Right renal interpolar punctate nephrolithiasis. 4. Right posterior bladder wall diverticulum measuring 2.4 cm. 5. Aortic atherosclerotic calcifications. Electronically signed by: Waddell Calk MD 08/13/2024 06:36 AM EST RP Workstation: [id number]   DG Chest Port 1 View Result Date: 08/13/2024 EXAM: 1 VIEW(S) XRAY OF THE CHEST 08/13/2024 04:58:00 AM COMPARISON: Chest radiographs 02/14/2012. CLINICAL HISTORY: 63 year old male. SOB. FINDINGS: LUNGS AND  PLEURA: Low lung volumes, similar to the prior. When allowing for portable technique, the lungs appear clear. No focal pulmonary opacity. No pulmonary edema. No pleural effusion. No pneumothorax. HEART AND MEDIASTINUM: No acute abnormality of the cardiac and mediastinal silhouettes. BONES AND SOFT TISSUES: No acute osseous abnormality. IMPRESSION: 1. No acute cardiopulmonary abnormality. Electronically signed by: Helayne Hurst MD 08/13/2024 05:42 AM EST RP Workstation: [id number]   Assessment/Plan LINZY DARLING is a 63 y.o. male with medical history significant for gout, hypertension, hyperlipidemia, obesity being admitted to the hospital with concern for UTI and acute cholecystitis.   Acute cholecystitis-keep n.p.o., pain and nausea control, general surgery consulted and plans laparoscopic cholecystectomy later this afternoon.  Will cover with IV Flagyl for the time being.  UTI-patient with grossly abnormal urinalysis, recent treatment for UTI.  Given his subjective chills, tremors suspect he may have a recurrent UTI.  For the time being we will cover with IV Rocephin in addition to the IV Flagyl as above.  Follow-up blood and urine cultures.  CKD stage IIIa-renal function appears to be at baseline  Elevated troponin-likely due to persistent tachycardia related to his abdominal pain, patient denies any chest pain and EKG is nonacute.  Will trend troponin.  DVT prophylaxis: SCDs only    Code Status: Full Code  Consults called: General Surgery  Admission status: The appropriate patient status for this patient is INPATIENT. Inpatient status is judged to be reasonable and necessary in order to provide the required intensity of service to ensure the patient's safety. The patient's presenting symptoms, physical exam findings, and initial radiographic and laboratory data in the context of their chronic comorbidities is felt to place them at high risk for further clinical deterioration. Furthermore, it is  not anticipated that the patient will be medically stable for discharge from the hospital within 2 midnights of admission.    I certify that at the point of admission it is my clinical judgment that the patient will require inpatient hospital care spanning beyond 2 midnights from the point of admission due to high intensity of service, high risk for further deterioration and high frequency of surveillance required  Time spent: 56 minutes  Jillian Pianka CHRISTELLA Gail MD [facility] Pager [phone]  If 7PM-7AM, please contact night-coverage [email or web address] Password [facility]  08/13/2024, 8:53 AM

## 2024-08-13 NOTE — Transfer of Care (Signed)
 Immediate Anesthesia Transfer of Care Note  Patient: Terry Cummings  Procedure(s) Performed: LAPAROSCOPIC CHOLECYSTECTOMY  Patient Location: PACU  Anesthesia Type:General  Level of Consciousness: awake and alert   Airway & Oxygen Therapy: Patient Spontanous Breathing, Patient connected to nasal cannula oxygen, and Patient connected to face mask oxygen  Post-op Assessment: Report given to RN and Post -op Vital signs reviewed and stable  Post vital signs: Reviewed and stable  Last Vitals:  Vitals Value Taken Time  BP 101/84 08/13/24 17:30  Temp    Pulse 105 08/13/24 17:29  Resp    SpO2 90 % 08/13/24 17:29  Vitals shown include unfiled device data.  Last Pain:  Vitals:   08/13/24 1356  TempSrc:   PainSc: 10-Worst pain ever         Complications: No notable events documented.

## 2024-08-13 NOTE — ED Provider Notes (Signed)
 [place] EMERGENCY DEPARTMENT AT [facility] Provider Note   CSN: [id number] Arrival date & time: 08/13/24  9571     Patient presents with: Shortness of Breath and Tachycardia   Terry Cummings is a 63 y.o. male.   The history is provided by the patient and medical records.  Shortness of Breath  63 y.o. M with hx of obesity, HTN, gout, presenting to the ED for SOB and tachycardia.  Woke up around 2am and was very sweaty, hot, and felt like he was trembling.  States seemed to just come out of nowhere.  Denies any sick contacts.  Denies chest pain, abdominal pain, nausea, vomiting, or diarrhea.  Was recently treated for UTI a few weeks ago, completed full course of bactrim.  Has been having increased urinary frequency recently.  Denies hematuria.  No prior hx of DM.  CBG en route 140.  Prior to Admission medications   Medication Sig Start Date End Date Taking? Authorizing Provider  allopurinol  (ZYLOPRIM ) 300 MG tablet Take 1 tablet (300 mg total) by mouth daily. 09/08/23   Rollene Almarie LABOR, MD  aspirin 81 MG tablet Take 81 mg by mouth daily.    [provider]  lisinopril -hydrochlorothiazide  (ZESTORETIC ) 20-12.5 MG tablet TAKE 1 TABLET BY MOUTH DAILY 06/09/24   Rollene Almarie LABOR, MD  meloxicam  (MOBIC ) 15 MG tablet Take 1 tablet (15 mg total) by mouth daily. 07/09/24   Henson, Vickie L, NP-C  pravastatin  (PRAVACHOL ) 20 MG tablet TAKE 1 TABLET BY MOUTH DAILY 06/09/24   Rollene Almarie LABOR, MD  sulfamethoxazole-trimethoprim (BACTRIM DS) 800-160 MG tablet Take 1 tablet by mouth 2 (two) times daily. 07/09/24   Henson, Vickie L, NP-C    Allergies: Codeine and Neomycin-bacitracin zn-polymyx    Review of Systems  Respiratory:  Positive for shortness of breath.   All other systems reviewed and are negative.   Updated Vital Signs BP (!) 138/114   Pulse (!) 126   Temp 98.5 F (36.9 C)   Resp 19   SpO2 95%   Physical Exam Vitals and nursing note reviewed.   Constitutional:      Appearance: He is well-developed.     Comments: Feels warm to touch, clammy  HENT:     Head: Normocephalic and atraumatic.  Eyes:     Conjunctiva/sclera: Conjunctivae normal.     Pupils: Pupils are equal, round, and reactive to light.  Cardiovascular:     Rate and Rhythm: Normal rate and regular rhythm.     Heart sounds: Normal heart sounds.     Comments: Tachy 120's Pulmonary:     Effort: Pulmonary effort is normal.     Breath sounds: Normal breath sounds. No wheezing or rhonchi.     Comments: Lungs grossly clear, no distress Abdominal:     General: Bowel sounds are normal.     Palpations: Abdomen is soft.     Tenderness: There is abdominal tenderness.   Musculoskeletal:        General: Normal range of motion.     Cervical back: Normal range of motion.  Skin:    General: Skin is warm and dry.  Neurological:     Mental Status: He is alert and oriented to person, place, and time.     (all labs ordered are listed, but only abnormal results are displayed) Labs Reviewed  CBC WITH DIFFERENTIAL/PLATELET - Abnormal; Notable for the following components:      Result Value   Hemoglobin 12.3 (*)  Neutro Abs 10.0 (*)    Lymphs Abs 0.2 (*)    Monocytes Absolute 0.0 (*)    All other components within normal limits  COMPREHENSIVE METABOLIC PANEL WITH GFR - Abnormal; Notable for the following components:   Glucose, Bld 117 (*)    BUN 26 (*)    Creatinine, Ser 2.09 (*)    GFR, Estimated 34 (*)    All other components within normal limits  URINALYSIS, W/ REFLEX TO CULTURE (INFECTION SUSPECTED) - Abnormal; Notable for the following components:   APPearance HAZY (*)    Hgb urine dipstick SMALL (*)    Protein, ur 30 (*)    Nitrite POSITIVE (*)    Leukocytes,Ua MODERATE (*)    Bacteria, UA MANY (*)    All other components within normal limits  TROPONIN T, HIGH SENSITIVITY - Abnormal; Notable for the following components:   Troponin T High Sensitivity 66 (*)     All other components within normal limits  RESP PANEL BY RT-PCR (RSV, FLU A&B, COVID)  RVPGX2  CULTURE, BLOOD (ROUTINE X 2)  CULTURE, BLOOD (ROUTINE X 2)  URINE CULTURE  PRO BRAIN NATRIURETIC PEPTIDE  I-STAT CG4 LACTIC ACID, ED  I-STAT CG4 LACTIC ACID, ED  TROPONIN T, HIGH SENSITIVITY    EKG: EKG Interpretation Date/Time:  Friday August 13 2024 04:43:00 EST Ventricular Rate:  123 PR Interval:  168 QRS Duration:  82 QT Interval:  303 QTC Calculation: 434 R Axis:   59  Text Interpretation: Sinus tachycardia Minimal ST elevation, inferior leads No previous ECGs available Confirmed by Griselda Norris [phone]) on 08/13/2024 4:46:28 AM  Radiology: CT Renal Stone Study Result Date: 08/13/2024 EXAM: CT UROGRAM 08/13/2024 06:25:00 AM TECHNIQUE: CT of the abdomen and pelvis was performed without the administration of intravenous contrast as per CT urogram protocol. Multiplanar reformatted images as well as MIP urogram images are provided for review. Automated exposure control, iterative reconstruction, and/or weight based adjustment of the mA/kV was utilized to reduce the radiation dose to as low as reasonably achievable. COMPARISON: 03/23/2006 CLINICAL HISTORY: FINDINGS: LOWER CHEST: Dependent changes noted in the posterior lung bases. Lingular and posterior right lower lobe scarring. LIVER: The liver is unremarkable. GALLBLADDER AND BILE DUCTS: Multiple tiny stones and sludge noted within the gallbladder. Gallbladder wall appears mildly thickened with diffuse pericholecystic soft tissue stranding. No biliary ductal dilatation. SPLEEN: The spleen is within normal limits in size and appearance. PANCREAS: The pancreas is normal in size and contour without focal lesion or ductal dilatation. ADRENAL GLANDS: No acute abnormality. KIDNEYS, URETERS AND BLADDER: Bilateral renal cortical scarring with progressive right renal cortical atrophy. Punctate stone noted within the interpolar right kidney, image  38/2. There are multiple indeterminate lesions arising off the right kidney. Index lesion off the upper pole measures 3.2 x 2.7 cm and 23 Hounsfield units. Off the inferior pole of the right kidney, there is a 2.6 x 2.3 cm exophytic lesion measuring 30 Hounsfield units. No stones in the ureters. No hydronephrosis. No perinephric or periureteral stranding. A 2.4 cm diverticula arises off the right posterior bladder wall, image 77/2. GI AND BOWEL: Stomach demonstrates no acute abnormality. The appendix is visualized and normal in caliber, without wall thickening, periappendiceal inflammation, or fluid. Sigmoid colon demonstrates diverticulosis without evidence of diverticulitis. No bowel wall thickening, pericolonic stranding, abscess, or free air. There is no bowel obstruction. PERITONEUM AND RETROPERITONEUM: No ascites. No free air. Trace fluid within the right posterior pelvis. VASCULATURE: Aorta is normal in caliber. Aortic  atherosclerotic calcifications. LYMPH NODES: No lymphadenopathy. REPRODUCTIVE ORGANS: Normal prostate gland. BONES AND SOFT TISSUES: Multilevel lumbar spondylosis. Bilateral L5 pars defects with first degree anterolisthesis of L5 on S1. No focal soft tissue abnormality. IMPRESSION: 1. Findings suspicious for acute cholecystitis given gallbladder calculi/sludge with mild wall thickening and pericholecystic stranding. Surgical consultation is recommended. 2. Indeterminate right renal masses measuring 3.2 x 2.7 cm (23 HU) at the upper pole and 2.6 x 2.3 cm (30 HU) at the inferior pole. Recommend Non-emergent renal protocol MRI or CT without and with contrast for characterization. 3. Right renal interpolar punctate nephrolithiasis. 4. Right posterior bladder wall diverticulum measuring 2.4 cm. 5. Aortic atherosclerotic calcifications. Electronically signed by: Waddell Calk MD 08/13/2024 06:36 AM EST RP Workstation: [id number]   DG Chest Port 1 View Result Date: 08/13/2024 EXAM: 1 VIEW(S) XRAY  OF THE CHEST 08/13/2024 04:58:00 AM COMPARISON: Chest radiographs 02/14/2012. CLINICAL HISTORY: 63 year old male. SOB. FINDINGS: LUNGS AND PLEURA: Low lung volumes, similar to the prior. When allowing for portable technique, the lungs appear clear. No focal pulmonary opacity. No pulmonary edema. No pleural effusion. No pneumothorax. HEART AND MEDIASTINUM: No acute abnormality of the cardiac and mediastinal silhouettes. BONES AND SOFT TISSUES: No acute osseous abnormality. IMPRESSION: 1. No acute cardiopulmonary abnormality. Electronically signed by: Helayne Hurst MD 08/13/2024 05:42 AM EST RP Workstation: [id number]     Procedures   CRITICAL CARE Performed by: Olam CHRISTELLA Slocumb   Total critical care time: 45 minutes  Critical care time was exclusive of separately billable procedures and treating other patients.  Critical care was necessary to treat or prevent imminent or life-threatening deterioration.  Critical care was time spent personally by me on the following activities: development of treatment plan with patient and/or surrogate as well as nursing, discussions with consultants, evaluation of patient's response to treatment, examination of patient, obtaining history from patient or surrogate, ordering and performing treatments and interventions, ordering and review of laboratory studies, ordering and review of radiographic studies, pulse oximetry and re-evaluation of patient's condition.   Medications Ordered in the ED  cefTRIAXone (ROCEPHIN) 2 g in sodium chloride 0.9 % 100 mL IVPB (2 g Intravenous New Bag/Given 08/13/24 0644)  metroNIDAZOLE (FLAGYL) IVPB 500 mg (has no administration in time range)  fentaNYL (SUBLIMAZE) injection 50 mcg (has no administration in time range)  sodium chloride 0.9 % bolus 1,000 mL (1,000 mLs Intravenous New Bag/Given 08/13/24 0454)  acetaminophen (TYLENOL) tablet 650 mg (650 mg Oral Given 08/13/24 9447)                                   Medical Decision  Making Amount and/or Complexity of Data Reviewed Labs: ordered. Radiology: ordered and independent interpretation performed. ECG/medicine tests: ordered and independent interpretation performed.  Risk OTC drugs. Prescription drug management. Decision regarding hospitalization.   63 y.o. M here with SOB and tachycardia.  Woke up at 2am sweating and trembling, sounds like he had rigors.  Felt feverish at home, did not check temp.  Did take tylenol at home prior to EMS.    He is warm to touch here, tachycardic 120's but BP stable.  His RR is normal,  NAD.  Lung sounds are clear.  Denies chest pain.  EKG without acute ischemia, sinus tach.  Did have recent UTI with culture + for E. Coli, completed course of bactrim.    Work-up initiated-- labs, lactate, cultures, CXR, UA.  IVF  started.    Lactate normal at 1.3.  WBC count 10.3.  chemistry with SrCr 2.09, slightly up from baseline.  Has IVF infusing.  RVP is negative.  CXR is clear.  Trop is 66.  He does have CKD.  Some of this may be demand from tachycardia as well.  No prior hx of DVT or PE.  No real risk factors for such either.  Will trend.  5:47 AM Now has temp of 99.49F on re-check.  Still very warm to the touch.  Given tylenol.  HR has slowed a bit but still tachy in the 110's.  Continues to deny and chest pain. RR is WNL.  Has a little pain on right lateral abdomen now.  Recent UTI, question if he may have infected stone or similar?  Awaiting UA.  Plan for stone study.  UA nitrite +, many bacteria.  Culture pending.  CT with findings of cholecystitis--has gallbladder sludge and calculi with wall thickening and Pericholecystic stranding.  He is given IV Rocephin and Flagyl to cover for UTI/cholecystitis.  Despite he will need a medical admission.  6:44 AM Spoke with on call general surgery, Dr. Tanda-- team will see this AM.  Agrees with medical admission.  Hospitalist paged for admission.  Final diagnoses:  Cholecystitis  Acute  cystitis with hematuria    ED Discharge Orders     None          Jarold Olam HERO, PA-C 08/13/24 0701    Griselda Norris, MD 08/14/24 [phone]

## 2024-08-13 NOTE — Anesthesia Procedure Notes (Signed)
 Procedure Name: Intubation Date/Time: 08/13/2024 4:08 PM  Performed by: Dartha Meckel, CRNAPre-anesthesia Checklist: Patient identified, Emergency Drugs available, Suction available and Patient being monitored Patient Re-evaluated:Patient Re-evaluated prior to induction Oxygen Delivery Method: Circle system utilized Preoxygenation: Pre-oxygenation with 100% oxygen Induction Type: IV induction Ventilation: Mask ventilation without difficulty Laryngoscope Size: Mac and 4 Grade View: Grade I Tube type: Oral Tube size: 7.5 mm Number of attempts: 1 Airway Equipment and Method: Stylet and Oral airway Placement Confirmation: ETT inserted through vocal cords under direct vision, positive ETCO2 and breath sounds checked- equal and bilateral Secured at: 21 cm Tube secured with: Tape Dental Injury: Teeth and Oropharynx as per pre-operative assessment

## 2024-08-13 NOTE — Anesthesia Postprocedure Evaluation (Signed)
 Anesthesia Post Note  Patient: Terry Cummings  Procedure(s) Performed: LAPAROSCOPIC CHOLECYSTECTOMY     Patient location during evaluation: PACU Anesthesia Type: General Level of consciousness: awake and alert Pain management: pain level controlled Vital Signs Assessment: post-procedure vital signs reviewed and stable Respiratory status: spontaneous breathing, nonlabored ventilation, respiratory function stable and patient connected to nasal cannula oxygen Cardiovascular status: blood pressure returned to baseline and stable Postop Assessment: no apparent nausea or vomiting Anesthetic complications: no   No notable events documented.  Last Vitals:  Vitals:   08/13/24 1830 08/13/24 1855  BP: 106/71 120/77  Pulse: (!) 102 96  Resp:  16  Temp:  36.8 C  SpO2: 95% 96%    Last Pain:  Vitals:   08/13/24 1855  TempSrc: Oral  PainSc:                  Thom JONELLE Peoples

## 2024-08-13 NOTE — Progress Notes (Signed)
   08/13/24 2255  Assess: MEWS Score  Temp 100 F (37.8 C)  BP 108/83  MAP (mmHg) 92  Pulse Rate (!) 120  Resp 16  SpO2 92 %  O2 Device Room Air  Patient Activity (if Appropriate) In bed  Assess: MEWS Score  MEWS Temp 0  MEWS Systolic 0  MEWS Pulse 2  MEWS RR 0  MEWS LOC 0  MEWS Score 2  MEWS Score Color Yellow  Assess: if the MEWS score is Yellow or Red  Were vital signs accurate and taken at a resting state? Yes  Does the patient meet 2 or more of the SIRS criteria? No  MEWS guidelines implemented  Yes, yellow  Treat  MEWS Interventions Considered administering scheduled or prn medications/treatments as ordered  Take Vital Signs  Increase Vital Sign Frequency  Yellow: Q2hr x1, continue Q4hrs until patient remains green for 12hrs  Escalate  MEWS: Escalate Yellow: Discuss with charge nurse and consider notifying provider and/or RRT  Notify: Charge Nurse/RN  Name of Charge Nurse/RN Notified Haematologist  Assess: SIRS CRITERIA  SIRS Temperature  0  SIRS Respirations  0  SIRS Pulse 1  SIRS WBC 0  SIRS Score Sum  1   RN rechecked vitals, tylenol given.

## 2024-08-13 NOTE — Op Note (Signed)
 PATIENT:  Terry Cummings  63 y.o. male  PRE-OPERATIVE DIAGNOSIS:  cholecystitis  POST-OPERATIVE DIAGNOSIS:  cholecystitis  PROCEDURE:  Procedure(s): LAPAROSCOPIC CHOLECYSTECTOMY   SURGEON:  Felicia Both, Herlene Righter, MD   ASSISTANT: Margretta Goldsmith, M.D.  ANESTHESIA:   local and general  Indications for procedure: Terry Cummings is a 63 y.o. male with symptoms of Abdominal pain and Nausea and vomiting consistent with gallbladder disease, Confirmed by CT.  Description of procedure: The patient was brought into the operative suite, placed supine. Anesthesia was administered with endotracheal tube. Patient was strapped in place and foot board was secured. All pressure points were offloaded by foam padding. The patient was prepped and draped in the usual sterile fashion.  A periumbilical incision was made and optical entry was used to enter the abdomen. 2 5 mm trocars were placed on in the right lateral space on in the right subcostal space. A 12mm trocar was placed in the subxiphoid space. Marcaine was infused to the subxiphoid space and lateral upper right abdomen in the transversus abdominis plane. Next the patient was placed in reverse trendelenberg. The gallbladder appearedgangrenous. Due to the level of dilation, the gallbladder was evacuated with suction.  The gallbladder was retracted cephalad and lateral. The peritoneum was reflected off the infundibulum working lateral to medial. The cystic duct and cystic artery were identified and further dissection revealed a critical view. The cystic artery was doubly clipped and ligated. The cystic duct did not fit a hem-o-lock clip so decision was made to use an endoloop. The gallbladder was removed off the liver bed with cautery. The cystic duct was taken with endoloop.   The gallbladder was removed off the liver bed with cautery. The Gallbladder was placed in a specimen bag. The gallbladder fossa was irrigated and hemostasis was applied with cautery.  The gallbladder was removed via the 12mm trocar. A 19 fr blake drain was placed with the tip in the gallbladder fossa and brought out the right lateral port site and sutured in place with a 2-0 Nylon, The fascial defect was closed with interrupted 0 vicryl suture via laparoscopic trans-fascial suture passer, loose stones were removed, and surgicel was placed into the gallbladder fossa. Pneumoperitoneum was removed, all trocar were removed. All incisions were closed with 4-0 monocryl subcuticular stitch. The patient woke from anesthesia and was brought to PACU in stable condition. All counts were correct  Findings: gangrenous cholecystitis  Specimen: gallbladder  Blood loss: 150 ml  Local anesthesia: 30 ml Marcaine  Complications: none  PLAN OF CARE: Admit to inpatient   PATIENT DISPOSITION:  PACU - hemodynamically stable.  Herlene Righter [facility], [place]

## 2024-08-14 ENCOUNTER — Inpatient Hospital Stay (HOSPITAL_COMMUNITY)

## 2024-08-14 ENCOUNTER — Encounter (HOSPITAL_COMMUNITY): Payer: Self-pay | Admitting: General Surgery

## 2024-08-14 DIAGNOSIS — N2889 Other specified disorders of kidney and ureter: Secondary | ICD-10-CM

## 2024-08-14 DIAGNOSIS — N281 Cyst of kidney, acquired: Secondary | ICD-10-CM

## 2024-08-14 DIAGNOSIS — B9620 Unspecified Escherichia coli [E. coli] as the cause of diseases classified elsewhere: Secondary | ICD-10-CM | POA: Diagnosis not present

## 2024-08-14 DIAGNOSIS — N261 Atrophy of kidney (terminal): Secondary | ICD-10-CM | POA: Diagnosis not present

## 2024-08-14 DIAGNOSIS — N1831 Chronic kidney disease, stage 3a: Secondary | ICD-10-CM | POA: Insufficient documentation

## 2024-08-14 DIAGNOSIS — Z9049 Acquired absence of other specified parts of digestive tract: Secondary | ICD-10-CM | POA: Diagnosis not present

## 2024-08-14 DIAGNOSIS — R7881 Bacteremia: Secondary | ICD-10-CM | POA: Diagnosis not present

## 2024-08-14 DIAGNOSIS — K810 Acute cholecystitis: Secondary | ICD-10-CM | POA: Diagnosis not present

## 2024-08-14 DIAGNOSIS — R7989 Other specified abnormal findings of blood chemistry: Secondary | ICD-10-CM | POA: Insufficient documentation

## 2024-08-14 LAB — BLOOD CULTURE ID PANEL (REFLEXED) - BCID2

## 2024-08-14 LAB — CBC
HCT: 36.8 % — ABNORMAL LOW (ref 39.0–52.0)
Hemoglobin: 11 g/dL — ABNORMAL LOW (ref 13.0–17.0)
MCH: 29 pg (ref 26.0–34.0)
MCHC: 29.9 g/dL — ABNORMAL LOW (ref 30.0–36.0)
MCV: 97.1 fL (ref 80.0–100.0)
Platelets: 229 K/uL (ref 150–400)
RBC: 3.79 MIL/uL — ABNORMAL LOW (ref 4.22–5.81)
RDW: 14.8 % (ref 11.5–15.5)
WBC: 13.2 K/uL — ABNORMAL HIGH (ref 4.0–10.5)
nRBC: 0 % (ref 0.0–0.2)

## 2024-08-14 LAB — BASIC METABOLIC PANEL WITH GFR
Anion gap: 10 (ref 5–15)
BUN: 32 mg/dL — ABNORMAL HIGH (ref 8–23)
CO2: 23 mmol/L (ref 22–32)
Calcium: 9 mg/dL (ref 8.9–10.3)
Chloride: 102 mmol/L (ref 98–111)
Creatinine, Ser: 2.01 mg/dL — ABNORMAL HIGH (ref 0.61–1.24)
GFR, Estimated: 36 mL/min — ABNORMAL LOW (ref >60)
Glucose, Bld: 172 mg/dL — ABNORMAL HIGH (ref 70–99)
Potassium: 4.9 mmol/L (ref 3.5–5.1)
Sodium: 136 mmol/L (ref 135–145)

## 2024-08-14 LAB — HIV ANTIBODY (ROUTINE TESTING W REFLEX): HIV Screen 4th Generation wRfx: NONREACTIVE

## 2024-08-14 MED ORDER — LORAZEPAM 2 MG/ML IJ SOLN
2.0000 mg | Freq: Once | INTRAMUSCULAR | Status: AC | PRN
  Administered 2024-08-14: 2 mg via INTRAVENOUS
  Filled 2024-08-14: qty 1

## 2024-08-14 MED ORDER — SODIUM CHLORIDE 0.9 % IV SOLN
2.0000 g | INTRAVENOUS | Status: DC
  Administered 2024-08-14 – 2024-08-16 (×3): 2 g via INTRAVENOUS
  Filled 2024-08-14 (×3): qty 20

## 2024-08-14 MED ORDER — GADOBUTROL 1 MMOL/ML IV SOLN
10.0000 mL | Freq: Once | INTRAVENOUS | Status: AC | PRN
  Administered 2024-08-14: 10 mL via INTRAVENOUS

## 2024-08-14 NOTE — Hospital Course (Signed)
 Terry Cummings is a 63 y.o. male with medical history significant for gout, hypertension, hyperlipidemia, obesity being admitted to the hospital with acute cholecystitis.  Patient states he was treated for a UTI as an outpatient about 3 weeks ago, symptoms completely resolved.  He was feeling fine until about 3 days ago when he was having vague intermittent right upper quadrant abdominal pain.    CT renal stone protocol was obtained which showed acute cholecystitis with gallbladder wall thickening and pericholecystic stranding.  Also findings noted of indeterminate right renal masses with nonemergent renal MRI recommended.   Assessment/Plan  Acute cholecystitis - CT renal protocol showing acute cholecystitis with gallbladder wall thickening and pericholecystic stranding -Underwent cholecystectomy on 08/13/2024 with gangrenous gallbladder noted - Drain in place, management per surgery; continued at discharge with plans for follow-up with surgery outpatient for removal -Transitioned antibiotics to Cipro to complete course for bacteremia discharge  E. coli bacteremia - Suspected from GI source with gangrenous gallbladder - s/p Rocephin; -Transitioned antibiotics to Cipro to complete course for bacteremia discharge - treat 7 days further after surgery/source control  - suspect E. coli is seeding urinary tract AKA secondary bacteruria   Right renal cysts - Noted on CT renal protocol with right renal masses.  Upper pole measures 3.2 x 2.7 cm and inferior pole measures 2.6 x 2.3 cm - Findings have been discussed with patient and wife bedside - Other findings include right renal nephrolithiasis and posterior bladder wall diverticulum, 2.4 cm - MRI renal protocol performed: No suspicious enhancing renal mass identified bilaterally. Right kidney Bosniak class 1 and 2 kidney cysts identified. According to consensus guidelines, no follow-up imaging is recommended for Bosniak class 1 and 2 kidney  cysts.  CKD stage IIIa-renal function appears to be at baseline   Elevated troponin-likely due to persistent tachycardia related to his abdominal pain, patient denies any chest pain and EKG is nonacute.  Troponin trend flat

## 2024-08-14 NOTE — Progress Notes (Signed)
 Progress Note    Terry Cummings   FMW:[id number]  DOB: May 05, 1961  DOA: 08/13/2024     1 PCP: Rollene Almarie LABOR, MD  Initial CC: Abdominal pain  Hospital Course: Terry Cummings is a 63 y.o. male with medical history significant for gout, hypertension, hyperlipidemia, obesity being admitted to the hospital with acute cholecystitis.  Patient states he was treated for a UTI as an outpatient about 3 weeks ago, symptoms completely resolved.  He was feeling fine until about 3 days ago when he was having vague intermittent right upper quadrant abdominal pain.    CT renal stone protocol was obtained which showed acute cholecystitis with gallbladder wall thickening and pericholecystic stranding.  Also findings noted of indeterminate right renal masses with nonemergent renal MRI recommended.   Assessment/Plan   Acute cholecystitis - CT renal protocol showing acute cholecystitis with gallbladder wall thickening and pericholecystic stranding -Underwent cholecystectomy on 08/13/2024 with gangrenous gallbladder noted - Drain in place, management per surgery -Continue Rocephin and Flagyl for now - Already on solid diet per surgery, monitor for tolerance and return of bowel function  E. coli bacteremia - Suspected from GI source with gangrenous gallbladder - Continue Rocephin - will plan on 10 day course total given complicated infection  - suspect E. coli is seeding urinary tract AKA secondary bacteruria   Right renal masses - Noted on CT renal protocol with right renal masses.  Upper pole measures 3.2 x 2.7 cm and inferior pole measures 2.6 x 2.3 cm -MRI renal protocol ordered - Findings have been discussed with patient and wife bedside - Other findings include right renal nephrolithiasis and posterior bladder wall diverticulum, 2.4 cm   CKD stage IIIa-renal function appears to be at baseline   Elevated troponin-likely due to persistent tachycardia related to his abdominal pain,  patient denies any chest pain and EKG is nonacute.  Troponin trend flat  Interval History:  Resting comfortably in bed when seen this morning.  Wife present bedside.  Tolerating diet but still no significant bowel movement since surgery.  Not ambulating much to the bathroom only; encouraged him to ambulate more. Reviewed findings of CT notedly the right renal mass; he is amenable with attempting MRI abdomen while hospitalized with premedication of Ativan.  If unable to tolerate, he would prefer doing outpatient MRI with open MRI if able.  Antimicrobials: Rocephin 08/13/2024 >> current Flagyl 08/13/2024 >> current  DVT prophylaxis:  SCDs Start: 08/13/24 0742   Code Status:   Code Status: Full Code  Mobility Assessment (Last 72 Hours)     Mobility Assessment     Row Name 08/14/24 0830 08/13/24 2200 08/13/24 2101 08/13/24 0907     Does the patient have exclusion criteria? No - Perform mobility assessment No - Perform mobility assessment No - Perform mobility assessment No - Perform mobility assessment    What is the highest level of mobility based on the mobility assessment? Level 5 (Ambulates independently) - Balance while walking independently - Complete Level 5 (Ambulates independently) - Balance while walking independently - Complete Level 4 (Ambulates with assistance) - Balance while stepping forward/back - Complete Level 5 (Ambulates independently) - Balance while walking independently - Complete       Diet: Diet Orders (From admission, onward)     Start     Ordered   08/13/24 1848  Diet Heart Room service appropriate? Yes; Fluid consistency: Thin  Diet effective now       Question Answer Comment  Room service  appropriate? Yes   Fluid consistency: Thin      08/13/24 1847            Barriers to discharge: None Disposition Plan: Home HH orders placed: N/A Status is: Inpatient  Objective: Blood pressure 105/70, pulse 80, temperature 97.9 F (36.6 C), resp. rate (!)  21, height 5' 6 (1.676 m), weight 102.6 kg, SpO2 98%.  Examination:  Physical Exam Constitutional:      General: He is not in acute distress.    Appearance: Normal appearance. He is obese.  HENT:     Head: Normocephalic and atraumatic.     Mouth/Throat:     Mouth: Mucous membranes are moist.  Eyes:     Extraocular Movements: Extraocular movements intact.  Cardiovascular:     Rate and Rhythm: Normal rate and regular rhythm.  Pulmonary:     Effort: Pulmonary effort is normal. No respiratory distress.     Breath sounds: Normal breath sounds. No wheezing.  Abdominal:     General: Bowel sounds are decreased. There is no distension.     Palpations: Abdomen is soft.     Comments: Right upper quadrant abdominal drain in place with bloody fluid noted in bulb  Musculoskeletal:        General: Normal range of motion.     Cervical back: Normal range of motion and neck supple.  Skin:    General: Skin is warm and dry.  Neurological:     General: No focal deficit present.     Mental Status: He is alert.  Psychiatric:        Mood and Affect: Mood normal.        Behavior: Behavior normal.      Consultants:  General surgery  Procedures:  08/13/2024: Cholecystectomy  Data Reviewed: Results for orders placed or performed during the hospital encounter of 08/13/24 (from the past 24 hours)  HIV Antibody (routine testing w rflx)     Status: None   Collection Time: 08/14/24  5:13 AM  Result Value Ref Range   HIV Screen 4th Generation wRfx Non Reactive Non Reactive  Basic metabolic panel     Status: Abnormal   Collection Time: 08/14/24  5:13 AM  Result Value Ref Range   Sodium 136 135 - 145 mmol/L   Potassium 4.9 3.5 - 5.1 mmol/L   Chloride 102 98 - 111 mmol/L   CO2 23 22 - 32 mmol/L   Glucose, Bld 172 (H) 70 - 99 mg/dL   BUN 32 (H) 8 - 23 mg/dL   Creatinine, Ser 7.98 (H) 0.61 - 1.24 mg/dL   Calcium 9.0 8.9 - 89.6 mg/dL   GFR, Estimated 36 (L) >60 mL/min   Anion gap 10 5 - 15  CBC      Status: Abnormal   Collection Time: 08/14/24  5:13 AM  Result Value Ref Range   WBC 13.2 (H) 4.0 - 10.5 K/uL   RBC 3.79 (L) 4.22 - 5.81 MIL/uL   Hemoglobin 11.0 (L) 13.0 - 17.0 g/dL   HCT 63.1 (L) 60.9 - 47.9 %   MCV 97.1 80.0 - 100.0 fL   MCH 29.0 26.0 - 34.0 pg   MCHC 29.9 (L) 30.0 - 36.0 g/dL   RDW 85.1 88.4 - 84.4 %   Platelets 229 150 - 400 K/uL   nRBC 0.0 0.0 - 0.2 %    I have reviewed pertinent nursing notes, vitals, labs, and images as necessary. I have ordered labwork to follow up on as  indicated.  I have reviewed the last notes from staff over past 24 hours. I have discussed patient's care plan and test results with nursing staff, CM/SW, and other staff as appropriate.  Old records reviewed in assessment of this patient  Time spent: Greater than 50% of the 55 minute visit was spent in counseling/coordination of care for the patient as laid out in the A&P.   LOS: 1 day   Alm Apo, MD [facility] 08/14/2024, 1:42 PM

## 2024-08-14 NOTE — Progress Notes (Signed)
 1 Day Post-Op   Subjective/Chief Complaint: Complains of soreness. Hasn't eaten yet   Objective: Vital signs in last 24 hours: Temp:  [97.7 F (36.5 C)-101 F (38.3 C)] 97.7 F (36.5 C) (11/08 0523) Pulse Rate:  [66-120] 66 (11/08 0523) Resp:  [14-22] 16 (11/08 0523) BP: (97-130)/(64-90) 102/69 (11/08 0523) SpO2:  [90 %-100 %] 100 % (11/08 0523) Weight:  [102.6 kg] 102.6 kg (11/07 0911) Last BM Date : 08/12/24  Intake/Output from previous day: 11/07 0701 - 11/08 0700 In: 680 [P.O.:480; IV Piggyback:200] Out: 230 [Drains:80; Blood:150] Intake/Output this shift: No intake/output data recorded.  General appearance: alert and cooperative Resp: clear to auscultation bilaterally Cardio: regular rate and rhythm GI: soft, mild tenderness. Drain output looks like old blood  Lab Results:  Recent Labs    08/13/24 0445 08/14/24 0513  WBC 10.3 13.2*  HGB 12.3* 11.0*  HCT 40.5 36.8*  PLT 197 229   BMET Recent Labs    08/13/24 0445 08/14/24 0513  NA 136 136  K 3.9 4.9  CL 101 102  CO2 24 23  GLUCOSE 117* 172*  BUN 26* 32*  CREATININE 2.09* 2.01*  CALCIUM 9.6 9.0   PT/INR No results for input(s): LABPROT, INR in the last 72 hours. ABG No results for input(s): PHART, HCO3 in the last 72 hours.  Invalid input(s): PCO2, PO2  Studies/Results: CT Renal Stone Study Result Date: 08/13/2024 EXAM: CT UROGRAM 08/13/2024 06:25:00 AM TECHNIQUE: CT of the abdomen and pelvis was performed without the administration of intravenous contrast as per CT urogram protocol. Multiplanar reformatted images as well as MIP urogram images are provided for review. Automated exposure control, iterative reconstruction, and/or weight based adjustment of the mA/kV was utilized to reduce the radiation dose to as low as reasonably achievable. COMPARISON: 03/23/2006 CLINICAL HISTORY: FINDINGS: LOWER CHEST: Dependent changes noted in the posterior lung bases. Lingular and posterior right  lower lobe scarring. LIVER: The liver is unremarkable. GALLBLADDER AND BILE DUCTS: Multiple tiny stones and sludge noted within the gallbladder. Gallbladder wall appears mildly thickened with diffuse pericholecystic soft tissue stranding. No biliary ductal dilatation. SPLEEN: The spleen is within normal limits in size and appearance. PANCREAS: The pancreas is normal in size and contour without focal lesion or ductal dilatation. ADRENAL GLANDS: No acute abnormality. KIDNEYS, URETERS AND BLADDER: Bilateral renal cortical scarring with progressive right renal cortical atrophy. Punctate stone noted within the interpolar right kidney, image 38/2. There are multiple indeterminate lesions arising off the right kidney. Index lesion off the upper pole measures 3.2 x 2.7 cm and 23 Hounsfield units. Off the inferior pole of the right kidney, there is a 2.6 x 2.3 cm exophytic lesion measuring 30 Hounsfield units. No stones in the ureters. No hydronephrosis. No perinephric or periureteral stranding. A 2.4 cm diverticula arises off the right posterior bladder wall, image 77/2. GI AND BOWEL: Stomach demonstrates no acute abnormality. The appendix is visualized and normal in caliber, without wall thickening, periappendiceal inflammation, or fluid. Sigmoid colon demonstrates diverticulosis without evidence of diverticulitis. No bowel wall thickening, pericolonic stranding, abscess, or free air. There is no bowel obstruction. PERITONEUM AND RETROPERITONEUM: No ascites. No free air. Trace fluid within the right posterior pelvis. VASCULATURE: Aorta is normal in caliber. Aortic atherosclerotic calcifications. LYMPH NODES: No lymphadenopathy. REPRODUCTIVE ORGANS: Normal prostate gland. BONES AND SOFT TISSUES: Multilevel lumbar spondylosis. Bilateral L5 pars defects with first degree anterolisthesis of L5 on S1. No focal soft tissue abnormality. IMPRESSION: 1. Findings suspicious for acute cholecystitis given gallbladder  calculi/sludge  with mild wall thickening and pericholecystic stranding. Surgical consultation is recommended. 2. Indeterminate right renal masses measuring 3.2 x 2.7 cm (23 HU) at the upper pole and 2.6 x 2.3 cm (30 HU) at the inferior pole. Recommend Non-emergent renal protocol MRI or CT without and with contrast for characterization. 3. Right renal interpolar punctate nephrolithiasis. 4. Right posterior bladder wall diverticulum measuring 2.4 cm. 5. Aortic atherosclerotic calcifications. Electronically signed by: Waddell Calk MD 08/13/2024 06:36 AM EST RP Workstation: [id number]   DG Chest Port 1 View Result Date: 08/13/2024 EXAM: 1 VIEW(S) XRAY OF THE CHEST 08/13/2024 04:58:00 AM COMPARISON: Chest radiographs 02/14/2012. CLINICAL HISTORY: 63 year old male. SOB. FINDINGS: LUNGS AND PLEURA: Low lung volumes, similar to the prior. When allowing for portable technique, the lungs appear clear. No focal pulmonary opacity. No pulmonary edema. No pleural effusion. No pneumothorax. HEART AND MEDIASTINUM: No acute abnormality of the cardiac and mediastinal silhouettes. BONES AND SOFT TISSUES: No acute osseous abnormality. IMPRESSION: 1. No acute cardiopulmonary abnormality. Electronically signed by: Helayne Hurst MD 08/13/2024 05:42 AM EST RP Workstation: [id number]    Anti-infectives: Anti-infectives (From admission, onward)    Start     Dose/Rate Route Frequency Ordered Stop   08/14/24 0600  cefTRIAXone (ROCEPHIN) 1 g in sodium chloride 0.9 % 100 mL IVPB  Status:  Discontinued        1 g 200 mL/hr over 30 Minutes Intravenous Every 24 hours 08/13/24 0717 08/14/24 0100   08/14/24 0200  cefTRIAXone (ROCEPHIN) 2 g in sodium chloride 0.9 % 100 mL IVPB        2 g 200 mL/hr over 30 Minutes Intravenous Every 24 hours 08/14/24 0100     08/13/24 1800  metroNIDAZOLE (FLAGYL) IVPB 500 mg        500 mg 100 mL/hr over 60 Minutes Intravenous Every 12 hours 08/13/24 0717     08/13/24 0645  cefTRIAXone (ROCEPHIN) 2 g in sodium  chloride 0.9 % 100 mL IVPB        2 g 200 mL/hr over 30 Minutes Intravenous  Once 08/13/24 0639 08/13/24 0714   08/13/24 0645  metroNIDAZOLE (FLAGYL) IVPB 500 mg        500 mg 100 mL/hr over 60 Minutes Intravenous  Once 08/13/24 0639 08/13/24 1033       Assessment/Plan: s/p Procedure(s) with comments: LAPAROSCOPIC CHOLECYSTECTOMY (N/A) - ICG Advance diet Continue drain POD 1 Work on pain control Hopefully ready for d/c tomorrow  LOS: 1 day    Deward Null III 08/14/2024

## 2024-08-14 NOTE — Plan of Care (Signed)
  Problem: Education: Goal: Knowledge of General Education information will improve Description: Including pain rating scale, medication(s)/side effects and non-pharmacologic comfort measures Outcome: Progressing   Problem: Clinical Measurements: Goal: Ability to maintain clinical measurements within normal limits will improve Outcome: Progressing   Problem: Activity: Goal: Risk for activity intolerance will decrease Outcome: Progressing   Problem: Nutrition: Goal: Adequate nutrition will be maintained Outcome: Progressing   Problem: Elimination: Goal: Will not experience complications related to bowel motility Outcome: Progressing Goal: Will not experience complications related to urinary retention Outcome: Progressing   Problem: Pain Managment: Goal: General experience of comfort will improve and/or be controlled Outcome: Progressing   Problem: Safety: Goal: Ability to remain free from injury will improve Outcome: Progressing   Problem: Skin Integrity: Goal: Risk for impaired skin integrity will decrease Outcome: Progressing

## 2024-08-14 NOTE — Progress Notes (Signed)
 PHARMACY - PHYSICIAN COMMUNICATION CRITICAL VALUE ALERT - BLOOD CULTURE IDENTIFICATION (BCID)  Terry Cummings is an 63 y.o. male who presented to [facility] on 08/13/2024 with a chief complaint of RUQ pain & shortness of breath.   Assessment:   Admit with gangrenous cholecystitis s/p laparoscopic cholecystectomy on 11/7.  Also concern for UTI.  Blood cx growing GNR; BCID + Ecoli, no resistance.   Name of physician (or Provider) Contacted: JINNY Kipper, NP  Current antibiotics: Rocephin 1gm + IV Flagyl  Changes to prescribed antibiotics recommended:  Increase Rocephin 2gm IV q24h Continue Flagyl for now  Results for orders placed or performed during the hospital encounter of 08/13/24  Blood Culture ID Panel (Reflexed) (Collected: 08/13/2024  4:45 AM)  Result Value Ref Range   Enterococcus faecalis NOT DETECTED NOT DETECTED   Enterococcus Faecium NOT DETECTED NOT DETECTED   Listeria monocytogenes NOT DETECTED NOT DETECTED   Staphylococcus species NOT DETECTED NOT DETECTED   Staphylococcus aureus (BCID) NOT DETECTED NOT DETECTED   Staphylococcus epidermidis NOT DETECTED NOT DETECTED   Staphylococcus lugdunensis NOT DETECTED NOT DETECTED   Streptococcus species NOT DETECTED NOT DETECTED   Streptococcus agalactiae NOT DETECTED NOT DETECTED   Streptococcus pneumoniae NOT DETECTED NOT DETECTED   Streptococcus pyogenes NOT DETECTED NOT DETECTED   A.calcoaceticus-baumannii NOT DETECTED NOT DETECTED   Bacteroides fragilis NOT DETECTED NOT DETECTED   Enterobacterales DETECTED (A) NOT DETECTED   Enterobacter cloacae complex NOT DETECTED NOT DETECTED   Escherichia coli DETECTED (A) NOT DETECTED   Klebsiella aerogenes NOT DETECTED NOT DETECTED   Klebsiella oxytoca NOT DETECTED NOT DETECTED   Klebsiella pneumoniae NOT DETECTED NOT DETECTED   Proteus species NOT DETECTED NOT DETECTED   Salmonella species NOT DETECTED NOT DETECTED   Serratia marcescens NOT DETECTED NOT DETECTED   Haemophilus  influenzae NOT DETECTED NOT DETECTED   Neisseria meningitidis NOT DETECTED NOT DETECTED   Pseudomonas aeruginosa NOT DETECTED NOT DETECTED   Stenotrophomonas maltophilia NOT DETECTED NOT DETECTED   Candida albicans NOT DETECTED NOT DETECTED   Candida auris NOT DETECTED NOT DETECTED   Candida glabrata NOT DETECTED NOT DETECTED   Candida krusei NOT DETECTED NOT DETECTED   Candida parapsilosis NOT DETECTED NOT DETECTED   Candida tropicalis NOT DETECTED NOT DETECTED   Cryptococcus neoformans/gattii NOT DETECTED NOT DETECTED   CTX-M ESBL NOT DETECTED NOT DETECTED   Carbapenem resistance IMP NOT DETECTED NOT DETECTED   Carbapenem resistance KPC NOT DETECTED NOT DETECTED   Carbapenem resistance NDM NOT DETECTED NOT DETECTED   Carbapenem resist OXA 48 LIKE NOT DETECTED NOT DETECTED   Carbapenem resistance VIM NOT DETECTED NOT DETECTED    Rosaline Millet PharmD 08/14/2024  1:07 AM

## 2024-08-15 DIAGNOSIS — N2889 Other specified disorders of kidney and ureter: Secondary | ICD-10-CM | POA: Diagnosis not present

## 2024-08-15 DIAGNOSIS — R7881 Bacteremia: Secondary | ICD-10-CM | POA: Diagnosis not present

## 2024-08-15 DIAGNOSIS — B9620 Unspecified Escherichia coli [E. coli] as the cause of diseases classified elsewhere: Secondary | ICD-10-CM | POA: Diagnosis not present

## 2024-08-15 DIAGNOSIS — K810 Acute cholecystitis: Secondary | ICD-10-CM | POA: Diagnosis not present

## 2024-08-15 LAB — CBC WITH DIFFERENTIAL/PLATELET
Abs Immature Granulocytes: 0.05 K/uL (ref 0.00–0.07)
Basophils Absolute: 0 K/uL (ref 0.0–0.1)
Basophils Relative: 0 %
Eosinophils Absolute: 0 K/uL (ref 0.0–0.5)
Eosinophils Relative: 0 %
HCT: 35.4 % — ABNORMAL LOW (ref 39.0–52.0)
Hemoglobin: 10.8 g/dL — ABNORMAL LOW (ref 13.0–17.0)
Immature Granulocytes: 0 %
Lymphocytes Relative: 8 %
Lymphs Abs: 1 K/uL (ref 0.7–4.0)
MCH: 29.9 pg (ref 26.0–34.0)
MCHC: 30.5 g/dL (ref 30.0–36.0)
MCV: 98.1 fL (ref 80.0–100.0)
Monocytes Absolute: 1.1 K/uL — ABNORMAL HIGH (ref 0.1–1.0)
Monocytes Relative: 9 %
Neutro Abs: 9.7 K/uL — ABNORMAL HIGH (ref 1.7–7.7)
Neutrophils Relative %: 83 %
Platelets: 233 K/uL (ref 150–400)
RBC: 3.61 MIL/uL — ABNORMAL LOW (ref 4.22–5.81)
RDW: 14.8 % (ref 11.5–15.5)
WBC: 11.8 K/uL — ABNORMAL HIGH (ref 4.0–10.5)
nRBC: 0 % (ref 0.0–0.2)

## 2024-08-15 LAB — MAGNESIUM: Magnesium: 2.3 mg/dL (ref 1.7–2.4)

## 2024-08-15 LAB — BASIC METABOLIC PANEL WITH GFR
Anion gap: 10 (ref 5–15)
BUN: 38 mg/dL — ABNORMAL HIGH (ref 8–23)
CO2: 23 mmol/L (ref 22–32)
Calcium: 9 mg/dL (ref 8.9–10.3)
Chloride: 103 mmol/L (ref 98–111)
Creatinine, Ser: 1.8 mg/dL — ABNORMAL HIGH (ref 0.61–1.24)
GFR, Estimated: 41 mL/min — ABNORMAL LOW (ref >60)
Glucose, Bld: 112 mg/dL — ABNORMAL HIGH (ref 70–99)
Potassium: 4.2 mmol/L (ref 3.5–5.1)
Sodium: 136 mmol/L (ref 135–145)

## 2024-08-15 LAB — URINE CULTURE: Culture: >=100000 — AB

## 2024-08-15 LAB — CULTURE, BLOOD (ROUTINE X 2): Special Requests: ADEQUATE

## 2024-08-15 MED ORDER — METRONIDAZOLE 500 MG PO TABS
500.0000 mg | ORAL_TABLET | Freq: Two times a day (BID) | ORAL | Status: DC
  Administered 2024-08-15 – 2024-08-16 (×2): 500 mg via ORAL
  Filled 2024-08-15 (×2): qty 1

## 2024-08-15 NOTE — Progress Notes (Signed)
 Progress Note    Terry Cummings   FMW:[id number]  DOB: December 22, 1960  DOA: 08/13/2024     2 PCP: Rollene Almarie LABOR, MD  Initial CC: Abdominal pain  Hospital Course: Terry Cummings is a 63 y.o. male with medical history significant for gout, hypertension, hyperlipidemia, obesity being admitted to the hospital with acute cholecystitis.  Patient states he was treated for a UTI as an outpatient about 3 weeks ago, symptoms completely resolved.  He was feeling fine until about 3 days ago when he was having vague intermittent right upper quadrant abdominal pain.    CT renal stone protocol was obtained which showed acute cholecystitis with gallbladder wall thickening and pericholecystic stranding.  Also findings noted of indeterminate right renal masses with nonemergent renal MRI recommended.   Assessment/Plan  Acute cholecystitis - CT renal protocol showing acute cholecystitis with gallbladder wall thickening and pericholecystic stranding -Underwent cholecystectomy on 08/13/2024 with gangrenous gallbladder noted - Drain in place, management per surgery -Continue Rocephin and Flagyl for now - Already on solid diet per surgery, monitor for tolerance and return of bowel function  E. coli bacteremia - Suspected from GI source with gangrenous gallbladder - Continue Rocephin - will plan on 10 day course total given complicated infection  - suspect E. coli is seeding urinary tract AKA secondary bacteruria   Right renal cysts - Noted on CT renal protocol with right renal masses.  Upper pole measures 3.2 x 2.7 cm and inferior pole measures 2.6 x 2.3 cm - Findings have been discussed with patient and wife bedside - Other findings include right renal nephrolithiasis and posterior bladder wall diverticulum, 2.4 cm - MRI renal protocol performed: No suspicious enhancing renal mass identified bilaterally. Right kidney Bosniak class 1 and 2 kidney cysts identified. According to consensus guidelines,  no follow-up imaging is recommended for Bosniak class 1 and 2 kidney cysts.  CKD stage IIIa-renal function appears to be at baseline   Elevated troponin-likely due to persistent tachycardia related to his abdominal pain, patient denies any chest pain and EKG is nonacute.  Troponin trend flat  Interval History:  No events overnight.  Reviewed findings of MRI abdomen with patient and wife at bedside.  They were relieved about results.  Otherwise he is doing about the same.  Possible discharge tomorrow.  Antimicrobials: Rocephin 08/13/2024 >> current Flagyl 08/13/2024 >> current  DVT prophylaxis:  SCDs Start: 08/13/24 0742   Code Status:   Code Status: Full Code  Mobility Assessment (Last 72 Hours)     Mobility Assessment     Row Name 08/15/24 0915 08/14/24 2030 08/14/24 0830 08/13/24 2200 08/13/24 2101   Does the patient have exclusion criteria? No - Perform mobility assessment No - Perform mobility assessment No - Perform mobility assessment No - Perform mobility assessment No - Perform mobility assessment   What is the highest level of mobility based on the mobility assessment? Level 5 (Ambulates independently) - Balance while walking independently - Complete Level 5 (Ambulates independently) - Balance while walking independently - Complete Level 5 (Ambulates independently) - Balance while walking independently - Complete Level 5 (Ambulates independently) - Balance while walking independently - Complete Level 4 (Ambulates with assistance) - Balance while stepping forward/back - Complete    Row Name 08/13/24 0907           Does the patient have exclusion criteria? No - Perform mobility assessment       What is the highest level of mobility based on the mobility  assessment? Level 5 (Ambulates independently) - Balance while walking independently - Complete          Diet: Diet Orders (From admission, onward)     Start     Ordered   08/13/24 1848  Diet Heart Room service  appropriate? Yes; Fluid consistency: Thin  Diet effective now       Question Answer Comment  Room service appropriate? Yes   Fluid consistency: Thin      08/13/24 1847            Barriers to discharge: None Disposition Plan: Home HH orders placed: N/A Status is: Inpatient  Objective: Blood pressure 131/81, pulse 76, temperature 98.1 F (36.7 C), temperature source Oral, resp. rate 18, height 5' 6 (1.676 m), weight 102.6 kg, SpO2 99%.  Examination:  Physical Exam Constitutional:      General: He is not in acute distress.    Appearance: Normal appearance. He is obese.  HENT:     Head: Normocephalic and atraumatic.     Mouth/Throat:     Mouth: Mucous membranes are moist.  Eyes:     Extraocular Movements: Extraocular movements intact.  Cardiovascular:     Rate and Rhythm: Normal rate and regular rhythm.  Pulmonary:     Effort: Pulmonary effort is normal. No respiratory distress.     Breath sounds: Normal breath sounds. No wheezing.  Abdominal:     General: Bowel sounds are decreased. There is no distension.     Palpations: Abdomen is soft.     Comments: Right upper quadrant abdominal drain in place with bloody fluid noted in bulb  Musculoskeletal:        General: Normal range of motion.     Cervical back: Normal range of motion and neck supple.  Skin:    General: Skin is warm and dry.  Neurological:     General: No focal deficit present.     Mental Status: He is alert.  Psychiatric:        Mood and Affect: Mood normal.        Behavior: Behavior normal.      Consultants:  General surgery  Procedures:  08/13/2024: Cholecystectomy  Data Reviewed: Results for orders placed or performed during the hospital encounter of 08/13/24 (from the past 24 hours)  Basic metabolic panel with GFR     Status: Abnormal   Collection Time: 08/15/24  5:34 AM  Result Value Ref Range   Sodium 136 135 - 145 mmol/L   Potassium 4.2 3.5 - 5.1 mmol/L   Chloride 103 98 - 111 mmol/L    CO2 23 22 - 32 mmol/L   Glucose, Bld 112 (H) 70 - 99 mg/dL   BUN 38 (H) 8 - 23 mg/dL   Creatinine, Ser 8.19 (H) 0.61 - 1.24 mg/dL   Calcium 9.0 8.9 - 89.6 mg/dL   GFR, Estimated 41 (L) >60 mL/min   Anion gap 10 5 - 15  CBC with Differential/Platelet     Status: Abnormal   Collection Time: 08/15/24  5:34 AM  Result Value Ref Range   WBC 11.8 (H) 4.0 - 10.5 K/uL   RBC 3.61 (L) 4.22 - 5.81 MIL/uL   Hemoglobin 10.8 (L) 13.0 - 17.0 g/dL   HCT 64.5 (L) 60.9 - 47.9 %   MCV 98.1 80.0 - 100.0 fL   MCH 29.9 26.0 - 34.0 pg   MCHC 30.5 30.0 - 36.0 g/dL   RDW 85.1 88.4 - 84.4 %   Platelets 233 150 -  400 K/uL   nRBC 0.0 0.0 - 0.2 %   Neutrophils Relative % 83 %   Neutro Abs 9.7 (H) 1.7 - 7.7 K/uL   Lymphocytes Relative 8 %   Lymphs Abs 1.0 0.7 - 4.0 K/uL   Monocytes Relative 9 %   Monocytes Absolute 1.1 (H) 0.1 - 1.0 K/uL   Eosinophils Relative 0 %   Eosinophils Absolute 0.0 0.0 - 0.5 K/uL   Basophils Relative 0 %   Basophils Absolute 0.0 0.0 - 0.1 K/uL   Immature Granulocytes 0 %   Abs Immature Granulocytes 0.05 0.00 - 0.07 K/uL  Magnesium     Status: None   Collection Time: 08/15/24  5:34 AM  Result Value Ref Range   Magnesium 2.3 1.7 - 2.4 mg/dL    I have reviewed pertinent nursing notes, vitals, labs, and images as necessary. I have ordered labwork to follow up on as indicated.  I have reviewed the last notes from staff over past 24 hours. I have discussed patient's care plan and test results with nursing staff, CM/SW, and other staff as appropriate.  Old records reviewed in assessment of this patient  Time spent: Greater than 50% of the 55 minute visit was spent in counseling/coordination of care for the patient as laid out in the A&P.   LOS: 2 days   Alm Apo, MD [facility] 08/15/2024, 1:43 PM

## 2024-08-15 NOTE — Progress Notes (Signed)
 2 Days Post-Op   Subjective/Chief Complaint: Complains of soreness on the right side   Objective: Vital signs in last 24 hours: Temp:  [97.6 F (36.4 C)-98 F (36.7 C)] 97.6 F (36.4 C) (11/09 0528) Pulse Rate:  [72-84] 80 (11/09 0528) Resp:  [16-18] 18 (11/09 0528) BP: (104-122)/(68-87) 122/87 (11/09 0528) SpO2:  [95 %-98 %] 97 % (11/09 0528) Last BM Date : 08/11/24  Intake/Output from previous day: 11/08 0701 - 11/09 0700 In: -  Out: 58 [Drains:58] Intake/Output this shift: No intake/output data recorded.  General appearance: alert and cooperative Resp: clear to auscultation bilaterally Cardio: regular rate and rhythm GI: Soft, mild tenderness on the right.  Drain output looks like old blood.  Hemoglobin stable  Lab Results:  Recent Labs    08/14/24 0513 08/15/24 0534  WBC 13.2* 11.8*  HGB 11.0* 10.8*  HCT 36.8* 35.4*  PLT 229 233   BMET Recent Labs    08/14/24 0513 08/15/24 0534  NA 136 136  K 4.9 4.2  CL 102 103  CO2 23 23  GLUCOSE 172* 112*  BUN 32* 38*  CREATININE 2.01* 1.80*  CALCIUM 9.0 9.0   PT/INR No results for input(s): LABPROT, INR in the last 72 hours. ABG No results for input(s): PHART, HCO3 in the last 72 hours.  Invalid input(s): PCO2, PO2  Studies/Results: MR ABDOMEN W WO CONTRAST Result Date: 08/15/2024 EXAM: MRCP WITH AND WITHOUT IV CONTRAST 08/14/2024 06:50:01 PM TECHNIQUE: Multisequence, multiplanar magnetic resonance images of the abdomen with and without intravenous contrast. MRCP sequences were performed. CONTRAST: 10 mL Gadavist. COMPARISON: CT findings from the previous day. CLINICAL HISTORY: Renal mass protocol, evaluation of right renal masses. FINDINGS: LIVER: Hepatic steatosis. No suspicious liver lesion. GALLBLADDER AND BILIARY SYSTEM: Status post cholecystectomy. Mixed signal intensity within the gallbladder fossa consistent with postoperative change. Percutaneous drainage catheter is identified terminating  within the gallbladder fossa and entering from a right lateral abdominal approach. No bile duct dilatation identified. SPLEEN: The spleen is within normal limits in size and appearance. PANCREAS/PANCREATIC DUCT: The pancreas is normal in size and contour without a focal lesion or ductal dilatation. ADRENAL GLANDS: Normal size and morphology bilaterally. No nodule, thickening, or hemorrhage. No periadrenal stranding. KIDNEYS: Bilateral kidneys are obscured by motion artifact. Bilateral renal cortical scarring and mild right renal atrophy noted. Multiple exophytic uniformly T2 hyperintense right kidney lesions are noted corresponding to the CT findings from the previous day. - bosniak class 1 cyst off the upper pole of the right kidney measures 2.8 cm (image 42/11). No convincing evidence of enhancement within this lesion on the postcontrast images. - bosniak class 2 cyst off the inferior pole of the right kidney is also T2 hyperintense with mild intrinsic increased T1 signal without signs of internal enhancement. -Adjacent right lower pole 1.2 cm uniformly T1 hyperintense and T2 isointense exophytic lesion without signs of internal enhancement is compatible with a Bosniak class 1 cyst. - nodule Lastly, there is an exophytic lesion off the anterior cortex of the interpolar right kidney measuring 0.7 cm which is T1 and T2 isointense and technically too small to reliably characterize (Bosniak class 2 cyst). No suspicious enhancing kidney lesions identified bilaterally. LYMPH NODES: No enlarged abdominal lymph nodes. VASCULATURE: Unremarkable. PERITONEUM: No ascites. ABDOMINAL WALL: No hernia. No mass. BOWEL: Grossly unremarkable. No bowel obstruction. BONES: No acute abnormality or worrisome osseous lesion. SOFT TISSUES: Unremarkable. MISCELLANEOUS: Atelectasis identified within both lung bases, likely related to recent cholecystectomy. Motion artifact diminishes exam detail.  IMPRESSION: 1. No suspicious enhancing renal  mass identified bilaterally. 2. Right kidney Bosniak class 1 and 2 kidney cysts identified. According to consensus guidelines, no follow-up imaging is recommended for Bosniak class 1 and 2 kidney cysts. 3. Status post cholecystectomy with percutaneous drainage catheter terminating in the gallbladder fossa. Electronically signed by: Waddell Calk MD 08/15/2024 07:17 AM EST RP Workstation: [id number]    Anti-infectives: Anti-infectives (From admission, onward)    Start     Dose/Rate Route Frequency Ordered Stop   08/15/24 1800  metroNIDAZOLE (FLAGYL) tablet 500 mg        500 mg Oral Every 12 hours 08/15/24 0842     08/14/24 0600  cefTRIAXone (ROCEPHIN) 1 g in sodium chloride 0.9 % 100 mL IVPB  Status:  Discontinued        1 g 200 mL/hr over 30 Minutes Intravenous Every 24 hours 08/13/24 0717 08/14/24 0100   08/14/24 0200  cefTRIAXone (ROCEPHIN) 2 g in sodium chloride 0.9 % 100 mL IVPB        2 g 200 mL/hr over 30 Minutes Intravenous Every 24 hours 08/14/24 0100     08/13/24 1800  metroNIDAZOLE (FLAGYL) IVPB 500 mg  Status:  Discontinued        500 mg 100 mL/hr over 60 Minutes Intravenous Every 12 hours 08/13/24 0717 08/15/24 0842   08/13/24 0645  cefTRIAXone (ROCEPHIN) 2 g in sodium chloride 0.9 % 100 mL IVPB        2 g 200 mL/hr over 30 Minutes Intravenous  Once 08/13/24 0639 08/13/24 0714   08/13/24 0645  metroNIDAZOLE (FLAGYL) IVPB 500 mg        500 mg 100 mL/hr over 60 Minutes Intravenous  Once 08/13/24 0639 08/13/24 1033       Assessment/Plan: s/p Procedure(s) with comments: LAPAROSCOPIC CHOLECYSTECTOMY (N/A) - ICG Advance diet Continue drain Postop day 2 Might need another day before ready for discharge  LOS: 2 days    Deward Null III 08/15/2024

## 2024-08-15 NOTE — Plan of Care (Signed)
   Problem: Education: Goal: Knowledge of General Education information will improve Description: Including pain rating scale, medication(s)/side effects and non-pharmacologic comfort measures Outcome: Progressing   Problem: Pain Managment: Goal: General experience of comfort will improve and/or be controlled Outcome: Progressing   Problem: Safety: Goal: Ability to remain free from injury will improve Outcome: Progressing

## 2024-08-16 ENCOUNTER — Other Ambulatory Visit (HOSPITAL_COMMUNITY): Payer: Self-pay

## 2024-08-16 DIAGNOSIS — R7881 Bacteremia: Secondary | ICD-10-CM | POA: Diagnosis not present

## 2024-08-16 DIAGNOSIS — B9620 Unspecified Escherichia coli [E. coli] as the cause of diseases classified elsewhere: Secondary | ICD-10-CM | POA: Diagnosis not present

## 2024-08-16 DIAGNOSIS — K810 Acute cholecystitis: Secondary | ICD-10-CM | POA: Diagnosis not present

## 2024-08-16 LAB — BASIC METABOLIC PANEL WITH GFR
Anion gap: 10 (ref 5–15)
BUN: 32 mg/dL — ABNORMAL HIGH (ref 8–23)
CO2: 22 mmol/L (ref 22–32)
Calcium: 9 mg/dL (ref 8.9–10.3)
Chloride: 103 mmol/L (ref 98–111)
Creatinine, Ser: 1.57 mg/dL — ABNORMAL HIGH (ref 0.61–1.24)
GFR, Estimated: 49 mL/min — ABNORMAL LOW (ref >60)
Glucose, Bld: 87 mg/dL (ref 70–99)
Potassium: 4 mmol/L (ref 3.5–5.1)
Sodium: 135 mmol/L (ref 135–145)

## 2024-08-16 LAB — CBC WITH DIFFERENTIAL/PLATELET
Abs Immature Granulocytes: 0.03 K/uL (ref 0.00–0.07)
Basophils Absolute: 0 K/uL (ref 0.0–0.1)
Basophils Relative: 0 %
Eosinophils Absolute: 0.2 K/uL (ref 0.0–0.5)
Eosinophils Relative: 2 %
HCT: 34.4 % — ABNORMAL LOW (ref 39.0–52.0)
Hemoglobin: 10.7 g/dL — ABNORMAL LOW (ref 13.0–17.0)
Immature Granulocytes: 0 %
Lymphocytes Relative: 16 %
Lymphs Abs: 1.3 K/uL (ref 0.7–4.0)
MCH: 29.6 pg (ref 26.0–34.0)
MCHC: 31.1 g/dL (ref 30.0–36.0)
MCV: 95 fL (ref 80.0–100.0)
Monocytes Absolute: 1 K/uL (ref 0.1–1.0)
Monocytes Relative: 12 %
Neutro Abs: 5.8 K/uL (ref 1.7–7.7)
Neutrophils Relative %: 70 %
Platelets: 228 K/uL (ref 150–400)
RBC: 3.62 MIL/uL — ABNORMAL LOW (ref 4.22–5.81)
RDW: 14.6 % (ref 11.5–15.5)
WBC: 8.3 K/uL (ref 4.0–10.5)
nRBC: 0 % (ref 0.0–0.2)

## 2024-08-16 LAB — MAGNESIUM: Magnesium: 2 mg/dL (ref 1.7–2.4)

## 2024-08-16 LAB — CULTURE, BLOOD (ROUTINE X 2): Special Requests: ADEQUATE

## 2024-08-16 MED ORDER — CIPROFLOXACIN HCL 500 MG PO TABS
500.0000 mg | ORAL_TABLET | Freq: Two times a day (BID) | ORAL | 0 refills | Status: AC
  Filled 2024-08-16: qty 8, 4d supply, fill #0

## 2024-08-16 MED ORDER — HYDROMORPHONE HCL 1 MG/ML IJ SOLN: 0.5000 mg | INTRAMUSCULAR | Status: DC | PRN

## 2024-08-16 MED ORDER — OXYCODONE HCL 5 MG PO TABS
5.0000 mg | ORAL_TABLET | ORAL | 0 refills | Status: DC | PRN
  Filled 2024-08-16: qty 30, 5d supply, fill #0

## 2024-08-16 MED ORDER — SENNOSIDES-DOCUSATE SODIUM 8.6-50 MG PO TABS
1.0000 | ORAL_TABLET | Freq: Two times a day (BID) | ORAL | Status: DC
  Administered 2024-08-16: 1 via ORAL
  Filled 2024-08-16: qty 1

## 2024-08-16 MED ORDER — POLYETHYLENE GLYCOL 3350 17 G PO PACK
17.0000 g | PACK | Freq: Every day | ORAL | Status: DC
  Administered 2024-08-16: 17 g via ORAL
  Filled 2024-08-16: qty 1

## 2024-08-16 MED ORDER — CIPROFLOXACIN HCL 500 MG PO TABS
500.0000 mg | ORAL_TABLET | Freq: Two times a day (BID) | ORAL | Status: DC
  Administered 2024-08-16: 500 mg via ORAL
  Filled 2024-08-16: qty 1

## 2024-08-16 NOTE — Progress Notes (Signed)
 Progress Note  3 Days Post-Op  Subjective: Patient having some abdominal soreness. Pain has improved overall. Tolerating HH diet without nausea and vomiting. Has not had a BM but having flatulence.   ROS  All negative with the exception of above.  Objective: Vital signs in last 24 hours: Temp:  [98.1 F (36.7 C)-98.3 F (36.8 C)] 98.2 F (36.8 C) (11/10 0455) Pulse Rate:  [54-76] 61 (11/10 0455) BP: (131-151)/(81-88) 151/83 (11/10 0455) SpO2:  [99 %] 99 % (11/09 1225) Last BM Date : 08/13/24  Intake/Output from previous day: 11/09 0701 - 11/10 0700 In: 480 [P.O.:480] Out: 45 [Drains:45] Intake/Output this shift: No intake/output data recorded.  PE: General: Pleasant male who is laying in bed in NAD. HEENT: Head is normocephalic, atraumatic.  Sclera are noninjected. EOMI. Ears and nose without any masses or lesions.  Mouth is pink and moist. Heart: HR normal during encounter.  Lungs: Respiratory effort nonlabored. Abd: Soft with mild distention. Generalized tenderness to palpation that is more prominent of the right abdomen. JP drain in place with thin brown/red fluid. No rebound tenderness or guarding. +BS. Skin: Warm and dry Psych: Alert and oriented. Responds appropriately.    Lab Results:  Recent Labs    08/15/24 0534 08/16/24 0443  WBC 11.8* 8.3  HGB 10.8* 10.7*  HCT 35.4* 34.4*  PLT 233 228   BMET Recent Labs    08/15/24 0534 08/16/24 0443  NA 136 135  K 4.2 4.0  CL 103 103  CO2 23 22  GLUCOSE 112* 87  BUN 38* 32*  CREATININE 1.80* 1.57*  CALCIUM 9.0 9.0   PT/INR No results for input(s): LABPROT, INR in the last 72 hours. CMP     Component Value Date/Time   NA 135 08/16/2024 0443   K 4.0 08/16/2024 0443   CL 103 08/16/2024 0443   CO2 22 08/16/2024 0443   GLUCOSE 87 08/16/2024 0443   BUN 32 (H) 08/16/2024 0443   CREATININE 1.57 (H) 08/16/2024 0443   CALCIUM 9.0 08/16/2024 0443   PROT 7.1 08/13/2024 0445   ALBUMIN 3.8 08/13/2024  0445   AST 17 08/13/2024 0445   ALT 15 08/13/2024 0445   ALKPHOS 95 08/13/2024 0445   BILITOT 1.0 08/13/2024 0445   GFRNONAA 49 (L) 08/16/2024 0443   GFRAA  03/01/2010 1805    >60        The eGFR has been calculated using the MDRD equation. This calculation has not been validated in all clinical situations. eGFR's persistently <60 mL/min signify possible Chronic Kidney Disease.   Lipase  No results found for: LIPASE     Studies/Results: MR ABDOMEN W WO CONTRAST Result Date: 08/15/2024 EXAM: MRCP WITH AND WITHOUT IV CONTRAST 08/14/2024 06:50:01 PM TECHNIQUE: Multisequence, multiplanar magnetic resonance images of the abdomen with and without intravenous contrast. MRCP sequences were performed. CONTRAST: 10 mL Gadavist. COMPARISON: CT findings from the previous day. CLINICAL HISTORY: Renal mass protocol, evaluation of right renal masses. FINDINGS: LIVER: Hepatic steatosis. No suspicious liver lesion. GALLBLADDER AND BILIARY SYSTEM: Status post cholecystectomy. Mixed signal intensity within the gallbladder fossa consistent with postoperative change. Percutaneous drainage catheter is identified terminating within the gallbladder fossa and entering from a right lateral abdominal approach. No bile duct dilatation identified. SPLEEN: The spleen is within normal limits in size and appearance. PANCREAS/PANCREATIC DUCT: The pancreas is normal in size and contour without a focal lesion or ductal dilatation. ADRENAL GLANDS: Normal size and morphology bilaterally. No nodule, thickening, or hemorrhage. No periadrenal  stranding. KIDNEYS: Bilateral kidneys are obscured by motion artifact. Bilateral renal cortical scarring and mild right renal atrophy noted. Multiple exophytic uniformly T2 hyperintense right kidney lesions are noted corresponding to the CT findings from the previous day. - bosniak class 1 cyst off the upper pole of the right kidney measures 2.8 cm (image 42/11). No convincing evidence of  enhancement within this lesion on the postcontrast images. - bosniak class 2 cyst off the inferior pole of the right kidney is also T2 hyperintense with mild intrinsic increased T1 signal without signs of internal enhancement. -Adjacent right lower pole 1.2 cm uniformly T1 hyperintense and T2 isointense exophytic lesion without signs of internal enhancement is compatible with a Bosniak class 1 cyst. - nodule Lastly, there is an exophytic lesion off the anterior cortex of the interpolar right kidney measuring 0.7 cm which is T1 and T2 isointense and technically too small to reliably characterize (Bosniak class 2 cyst). No suspicious enhancing kidney lesions identified bilaterally. LYMPH NODES: No enlarged abdominal lymph nodes. VASCULATURE: Unremarkable. PERITONEUM: No ascites. ABDOMINAL WALL: No hernia. No mass. BOWEL: Grossly unremarkable. No bowel obstruction. BONES: No acute abnormality or worrisome osseous lesion. SOFT TISSUES: Unremarkable. MISCELLANEOUS: Atelectasis identified within both lung bases, likely related to recent cholecystectomy. Motion artifact diminishes exam detail. IMPRESSION: 1. No suspicious enhancing renal mass identified bilaterally. 2. Right kidney Bosniak class 1 and 2 kidney cysts identified. According to consensus guidelines, no follow-up imaging is recommended for Bosniak class 1 and 2 kidney cysts. 3. Status post cholecystectomy with percutaneous drainage catheter terminating in the gallbladder fossa. Electronically signed by: Waddell Calk MD 08/15/2024 07:17 AM EST RP Workstation: [id number]    Anti-infectives: Anti-infectives (From admission, onward)    Start     Dose/Rate Route Frequency Ordered Stop   08/15/24 1800  metroNIDAZOLE (FLAGYL) tablet 500 mg        500 mg Oral Every 12 hours 08/15/24 0842     08/14/24 0600  cefTRIAXone (ROCEPHIN) 1 g in sodium chloride 0.9 % 100 mL IVPB  Status:  Discontinued        1 g 200 mL/hr over 30 Minutes Intravenous Every 24 hours  08/13/24 0717 08/14/24 0100   08/14/24 0200  cefTRIAXone (ROCEPHIN) 2 g in sodium chloride 0.9 % 100 mL IVPB        2 g 200 mL/hr over 30 Minutes Intravenous Every 24 hours 08/14/24 0100     08/13/24 1800  metroNIDAZOLE (FLAGYL) IVPB 500 mg  Status:  Discontinued        500 mg 100 mL/hr over 60 Minutes Intravenous Every 12 hours 08/13/24 0717 08/15/24 0842   08/13/24 0645  cefTRIAXone (ROCEPHIN) 2 g in sodium chloride 0.9 % 100 mL IVPB        2 g 200 mL/hr over 30 Minutes Intravenous  Once 08/13/24 0639 08/13/24 0714   08/13/24 0645  metroNIDAZOLE (FLAGYL) IVPB 500 mg        500 mg 100 mL/hr over 60 Minutes Intravenous  Once 08/13/24 0639 08/13/24 1033        Assessment/Plan POD 3: S/P LAPAROSCOPIC CHOLECYSTECTOMY - ICG by Dr. Stevie 11/7 -Afebrile. -Cr improved to 1.57; IVF per primary team. -WBC 8.3 from 11.8; HGB 10.7. -Having soreness. Encouraged PO pain medications as he is getting closer to discharge. Will be longer acting as well. Pt has not had BM and has been provided with miralax and senna-docusate. -JP drain output noted to be 45 mL from 11/19-11/10. Patient to follow up for  outpatient nurse visit regarding removal of drain in 1 week. Will also have follow up with provider.  -Will discuss with MD whether patient will need antibiotics at discharge. -Discussed post-operative expectations and restrictions in great detail. Precautions provided. Will provide letter for work. All questions were answered. Stable from general surgery standpoint. Please call for further questions or concerns.   FEN: HH; IVF per primary team VTE: SCDs ID: Ceftriaxone/metronidazole    LOS: 3 days   I reviewed hosptialist notes, nursing notes, last 24 h vitals and pain scores, last 48 h intake and output, last 24 h labs and trends, and last 24 h imaging results.   Marjorie Carlyon Favre, [facility] 08/16/2024, 10:01 AM Please see Amion for pager number during day hours  7:00am-4:30pm

## 2024-08-16 NOTE — Plan of Care (Signed)

## 2024-08-16 NOTE — Plan of Care (Signed)
   Problem: Education: Goal: Knowledge of General Education information will improve Description: Including pain rating scale, medication(s)/side effects and non-pharmacologic comfort measures Outcome: Progressing   Problem: Pain Managment: Goal: General experience of comfort will improve and/or be controlled Outcome: Progressing   Problem: Safety: Goal: Ability to remain free from injury will improve Outcome: Progressing

## 2024-08-16 NOTE — Discharge Summary (Signed)
 Physician Discharge Summary   Terry Cummings FMW:[id number] DOB: September 02, 1961 DOA: 08/13/2024  PCP: Rollene Almarie LABOR, MD  Admit date: 08/13/2024 Discharge date: 08/16/2024  Admitted From: Home Disposition:  Home Discharging physician: Alm Apo, MD Barriers to discharge: none  Recommendations at discharge: Follow up with surgery  Discharge Condition: stable CODE STATUS: Full  Diet recommendation:  Diet Orders (From admission, onward)     Start     Ordered   08/16/24 0000  Diet - low sodium heart healthy        08/16/24 1706   08/13/24 1848  Diet Heart Room service appropriate? Yes; Fluid consistency: Thin  Diet effective now       Question Answer Comment  Room service appropriate? Yes   Fluid consistency: Thin      08/13/24 1847            Hospital Course: Terry Cummings is a 63 y.o. male with medical history significant for gout, hypertension, hyperlipidemia, obesity being admitted to the hospital with acute cholecystitis.  Patient states he was treated for a UTI as an outpatient about 3 weeks ago, symptoms completely resolved.  He was feeling fine until about 3 days ago when he was having vague intermittent right upper quadrant abdominal pain.    CT renal stone protocol was obtained which showed acute cholecystitis with gallbladder wall thickening and pericholecystic stranding.  Also findings noted of indeterminate right renal masses with nonemergent renal MRI recommended.   Assessment/Plan  Acute cholecystitis - CT renal protocol showing acute cholecystitis with gallbladder wall thickening and pericholecystic stranding -Underwent cholecystectomy on 08/13/2024 with gangrenous gallbladder noted - Drain in place, management per surgery; continued at discharge with plans for follow-up with surgery outpatient for removal -Transitioned antibiotics to Cipro to complete course for bacteremia discharge  E. coli bacteremia - Suspected from GI source with gangrenous  gallbladder - s/p Rocephin; -Transitioned antibiotics to Cipro to complete course for bacteremia discharge - treat 7 days further after surgery/source control  - suspect E. coli is seeding urinary tract AKA secondary bacteruria   Right renal cysts - Noted on CT renal protocol with right renal masses.  Upper pole measures 3.2 x 2.7 cm and inferior pole measures 2.6 x 2.3 cm - Findings have been discussed with patient and wife bedside - Other findings include right renal nephrolithiasis and posterior bladder wall diverticulum, 2.4 cm - MRI renal protocol performed: No suspicious enhancing renal mass identified bilaterally. Right kidney Bosniak class 1 and 2 kidney cysts identified. According to consensus guidelines, no follow-up imaging is recommended for Bosniak class 1 and 2 kidney cysts.  CKD stage IIIa-renal function appears to be at baseline   Elevated troponin-likely due to persistent tachycardia related to his abdominal pain, patient denies any chest pain and EKG is nonacute.  Troponin trend flat   The patient's acute and chronic medical conditions were treated accordingly. On day of discharge, patient was felt deemed stable for discharge. Patient/family member advised to call PCP or come back to ER if needed.   Principal Diagnosis: Acute cholecystitis  Discharge Diagnoses: Active Hospital Problems   Diagnosis Date Noted   Acute cholecystitis 08/13/2024    Priority: 1.   E coli bacteremia 08/14/2024    Priority: 2.   Renal cyst, right 08/14/2024    Priority: 3.   Chronic kidney disease, stage 3a (HCC) 08/14/2024   Elevated troponin 08/14/2024    Resolved Hospital Problems  No resolved problems to display.  Discharge Instructions     Diet - low sodium heart healthy   Complete by: As directed    Discharge wound care:   Complete by: As directed    Continue drain care   Increase activity slowly   Complete by: As directed       Allergies as of 08/16/2024        Reactions   Codeine    REACTION: nausea   Neomycin-bacitracin Zn-polymyx    REACTION: rash/itching        Medication List     TAKE these medications    allopurinol  300 MG tablet Commonly known as: ZYLOPRIM  Take 1 tablet (300 mg total) by mouth daily.   aspirin 81 MG tablet Take 81 mg by mouth daily.   ciprofloxacin 500 MG tablet Commonly known as: CIPRO Take 1 tablet (500 mg total) by mouth 2 (two) times daily for 4 days.   lisinopril -hydrochlorothiazide  20-12.5 MG tablet Commonly known as: ZESTORETIC  TAKE 1 TABLET BY MOUTH DAILY   oxyCODONE 5 MG immediate release tablet Commonly known as: Oxy IR/ROXICODONE Take 1 tablet (5 mg total) by mouth every 4 (four) hours as needed for moderate pain (pain score 4-6) or severe pain (pain score 7-10).   pravastatin  20 MG tablet Commonly known as: PRAVACHOL  TAKE 1 TABLET BY MOUTH DAILY               Discharge Care Instructions  (From admission, onward)           Start     Ordered   08/16/24 0000  Discharge wound care:       Comments: Continue drain care   08/16/24 1706            Follow-up Information     Maczis, Puja Gosai, PA-C Follow up on 09/09/2024.   Specialty: General Surgery Why: at 1:45 for post-operative follow up. Please arrive 20-30 minutes early. Contact information: [address] [place] [address] [phone]         [facility], [place]. Go on 08/23/2024.   Specialty: General Surgery Why: 9:30AM: This appointment will be for nurse visit to have drain assessed. Contact information: [address] [place]  [address] [phone]               Allergies  Allergen Reactions   Codeine     REACTION: nausea   Neomycin-Bacitracin Zn-Polymyx     REACTION: rash/itching    Consultations: General surgery  Procedures:  08/13/2024: Cholecystectomy   Discharge Exam: BP (!) 151/83 (BP Location: Left Arm)   Pulse 61    Temp 98.2 F (36.8 C) (Oral)   Resp 18   Ht 5' 6 (1.676 m)   Wt 102.6 kg   SpO2 99%   BMI 36.51 kg/m  Physical Exam Constitutional:      General: He is not in acute distress.    Appearance: Normal appearance. He is obese.  HENT:     Head: Normocephalic and atraumatic.     Mouth/Throat:     Mouth: Mucous membranes are moist.  Eyes:     Extraocular Movements: Extraocular movements intact.  Cardiovascular:     Rate and Rhythm: Normal rate and regular rhythm.  Pulmonary:     Effort: Pulmonary effort is normal. No respiratory distress.     Breath sounds: Normal breath sounds. No wheezing.  Abdominal:     General: Bowel sounds are normal. There is no distension.  Palpations: Abdomen is soft.     Tenderness: There is abdominal tenderness (generalized, post op).     Comments: Right upper quadrant abdominal drain in place with bloody fluid noted in bulb  Musculoskeletal:        General: Normal range of motion.     Cervical back: Normal range of motion and neck supple.  Skin:    General: Skin is warm and dry.  Neurological:     General: No focal deficit present.     Mental Status: He is alert.  Psychiatric:        Mood and Affect: Mood normal.        Behavior: Behavior normal.      The results of significant diagnostics from this hospitalization (including imaging, microbiology, ancillary and laboratory) are listed below for reference.   Microbiology: Recent Results (from the past 240 hours)  Resp panel by RT-PCR (RSV, Flu A&B, Covid) Anterior Nasal Swab     Status: None   Collection Time: 08/13/24  4:45 AM   Specimen: Anterior Nasal Swab  Result Value Ref Range Status   SARS Coronavirus 2 by RT PCR NEGATIVE NEGATIVE Final    Comment: (NOTE) SARS-CoV-2 target nucleic acids are NOT DETECTED.  The SARS-CoV-2 RNA is generally detectable in upper respiratory specimens during the acute phase of infection. The lowest concentration of SARS-CoV-2 viral copies this assay can  detect is 138 copies/mL. A negative result does not preclude SARS-Cov-2 infection and should not be used as the sole basis for treatment or other patient management decisions. A negative result may occur with  improper specimen collection/handling, submission of specimen other than nasopharyngeal swab, presence of viral mutation(s) within the areas targeted by this assay, and inadequate number of viral copies(<138 copies/mL). A negative result must be combined with clinical observations, patient history, and epidemiological information. The expected result is Negative.  Fact Sheet for Patients:  bloggercourse.com  Fact Sheet for Healthcare Providers:  seriousbroker.it  This test is no t yet approved or cleared by the [place]  FDA and  has been authorized for detection and/or diagnosis of SARS-CoV-2 by FDA under an Emergency Use Authorization (EUA). This EUA will remain  in effect (meaning this test can be used) for the duration of the COVID-19 declaration under Section 564(b)(1) of the Act, 21 U.S.C.section 360bbb-3(b)(1), unless the authorization is terminated  or revoked sooner.       Influenza A by PCR NEGATIVE NEGATIVE Final   Influenza B by PCR NEGATIVE NEGATIVE Final    Comment: (NOTE) The Xpert Xpress SARS-CoV-2/FLU/RSV plus assay is intended as an aid in the diagnosis of influenza from Nasopharyngeal swab specimens and should not be used as a sole basis for treatment. Nasal washings and aspirates are unacceptable for Xpert Xpress SARS-CoV-2/FLU/RSV testing.  Fact Sheet for Patients: bloggercourse.com  Fact Sheet for Healthcare Providers: seriousbroker.it  This test is not yet approved or cleared by the [place]  FDA and has been authorized for detection and/or diagnosis of SARS-CoV-2 by FDA under an Emergency Use Authorization (EUA). This EUA will remain in  effect (meaning this test can be used) for the duration of the COVID-19 declaration under Section 564(b)(1) of the Act, 21 U.S.C. section 360bbb-3(b)(1), unless the authorization is terminated or revoked.     Resp Syncytial Virus by PCR NEGATIVE NEGATIVE Final    Comment: (NOTE) Fact Sheet for Patients: bloggercourse.com  Fact Sheet for Healthcare Providers: seriousbroker.it  This test is not yet approved or cleared by the [place]  [place] FDA and has been authorized for detection and/or diagnosis of SARS-CoV-2 by FDA under an Emergency Use Authorization (EUA). This EUA will remain in effect (meaning this test can be used) for the duration of the COVID-19 declaration under Section 564(b)(1) of the Act, 21 U.S.C. section 360bbb-3(b)(1), unless the authorization is terminated or revoked.  Performed at [facility], [address] [place], [place] [address]   Blood culture (routine x 2)     Status: Abnormal   Collection Time: 08/13/24  4:45 AM   Specimen: BLOOD  Result Value Ref Range Status   Specimen Description   Final    BLOOD BLOOD RIGHT ARM Performed at [facility], [address], [place], [place] [address]    Special Requests   Final    Blood Culture adequate volume BOTTLES DRAWN AEROBIC AND ANAEROBIC Performed at [facility], [address], [place], [place] [address]    Culture  Setup Time   Final    GRAM NEGATIVE RODS IN BOTH AEROBIC AND ANAEROBIC BOTTLES   MITCHELL PHARMD 08/14/2024 @0100  BY DD Performed at [facility], [address] [place], [place] [address]    Culture ESCHERICHIA COLI (A)  Final   Report Status 08/15/2024 FINAL  Final   Organism ID, Bacteria ESCHERICHIA COLI  Final      Susceptibility   Escherichia coli - MIC*    AMPICILLIN <=2 SENSITIVE Sensitive     CEFAZOLIN (NON-URINE) <=1 SENSITIVE Sensitive     CEFEPIME <=0.12 SENSITIVE  Sensitive     ERTAPENEM <=0.12 SENSITIVE Sensitive     CEFTRIAXONE <=0.25 SENSITIVE Sensitive     CIPROFLOXACIN <=0.06 SENSITIVE Sensitive     GENTAMICIN <=1 SENSITIVE Sensitive     MEROPENEM <=0.25 SENSITIVE Sensitive     TRIMETH/SULFA <=20 SENSITIVE Sensitive     AMPICILLIN/SULBACTAM <=2 SENSITIVE Sensitive     PIP/TAZO Value in next row Sensitive      <=4 SENSITIVEThis is a modified FDA-approved test that has been validated and its performance characteristics determined by the reporting laboratory.  This laboratory is certified under the Clinical Laboratory Improvement Amendments CLIA as qualified to perform high complexity clinical laboratory testing.    * ESCHERICHIA COLI  Blood culture (routine x 2)     Status: Abnormal   Collection Time: 08/13/24  4:45 AM   Specimen: BLOOD  Result Value Ref Range Status   Specimen Description   Final    BLOOD BLOOD LEFT FOREARM Performed at [facility], [address] [place], [place] [address]    Special Requests   Final    Blood Culture adequate volume BOTTLES DRAWN AEROBIC ONLY Performed at [facility], [address], [place], [place] [address]    Culture  Setup Time   Final    GRAM NEGATIVE RODS AEROBIC BOTTLE ONLY CRITICAL VALUE NOTED.  VALUE IS CONSISTENT WITH PREVIOUSLY REPORTED AND CALLED VALUE. Performed at [facility], [address], [place], [place] [address]    Culture ESCHERICHIA COLI (A)  Final   Report Status 08/16/2024 FINAL  Final  Urine Culture     Status: Abnormal   Collection Time: 08/13/24  4:45 AM   Specimen: Urine, Random  Result Value Ref Range Status   Specimen Description   Final    URINE, RANDOM Performed at [facility], [address] [place], [place] [address]    Special Requests   Final  NONE Reflexed from [id number] Performed at [facility], [address], [place], [place] [address]    Culture >=100,000 COLONIES/mL  ESCHERICHIA COLI (A)  Final   Report Status 08/15/2024 FINAL  Final   Organism ID, Bacteria ESCHERICHIA COLI (A)  Final      Susceptibility   Escherichia coli - MIC*    AMPICILLIN <=2 SENSITIVE Sensitive     CEFAZOLIN (URINE) Value in next row Sensitive      <=1 SENSITIVEThis is a modified FDA-approved test that has been validated and its performance characteristics determined by the reporting laboratory.  This laboratory is certified under the Clinical Laboratory Improvement Amendments CLIA as qualified to perform high complexity clinical laboratory testing.    CEFEPIME Value in next row Sensitive      <=1 SENSITIVEThis is a modified FDA-approved test that has been validated and its performance characteristics determined by the reporting laboratory.  This laboratory is certified under the Clinical Laboratory Improvement Amendments CLIA as qualified to perform high complexity clinical laboratory testing.    ERTAPENEM Value in next row Sensitive      <=1 SENSITIVEThis is a modified FDA-approved test that has been validated and its performance characteristics determined by the reporting laboratory.  This laboratory is certified under the Clinical Laboratory Improvement Amendments CLIA as qualified to perform high complexity clinical laboratory testing.    CEFTRIAXONE Value in next row Sensitive      <=1 SENSITIVEThis is a modified FDA-approved test that has been validated and its performance characteristics determined by the reporting laboratory.  This laboratory is certified under the Clinical Laboratory Improvement Amendments CLIA as qualified to perform high complexity clinical laboratory testing.    CIPROFLOXACIN Value in next row Sensitive      <=1 SENSITIVEThis is a modified FDA-approved test that has been validated and its performance characteristics determined by the reporting laboratory.  This laboratory is certified under the Clinical Laboratory Improvement Amendments CLIA as qualified to  perform high complexity clinical laboratory testing.    GENTAMICIN Value in next row Sensitive      <=1 SENSITIVEThis is a modified FDA-approved test that has been validated and its performance characteristics determined by the reporting laboratory.  This laboratory is certified under the Clinical Laboratory Improvement Amendments CLIA as qualified to perform high complexity clinical laboratory testing.    NITROFURANTOIN Value in next row Sensitive      <=1 SENSITIVEThis is a modified FDA-approved test that has been validated and its performance characteristics determined by the reporting laboratory.  This laboratory is certified under the Clinical Laboratory Improvement Amendments CLIA as qualified to perform high complexity clinical laboratory testing.    TRIMETH/SULFA Value in next row Sensitive      <=1 SENSITIVEThis is a modified FDA-approved test that has been validated and its performance characteristics determined by the reporting laboratory.  This laboratory is certified under the Clinical Laboratory Improvement Amendments CLIA as qualified to perform high complexity clinical laboratory testing.    AMPICILLIN/SULBACTAM Value in next row Sensitive      <=1 SENSITIVEThis is a modified FDA-approved test that has been validated and its performance characteristics determined by the reporting laboratory.  This laboratory is certified under the Clinical Laboratory Improvement Amendments CLIA as qualified to perform high complexity clinical laboratory testing.    PIP/TAZO Value in next row Sensitive      <=4 SENSITIVEThis is a modified FDA-approved test that has been validated and its performance characteristics determined by the reporting laboratory.  This  laboratory is certified under the Clinical Laboratory Improvement Amendments CLIA as qualified to perform high complexity clinical laboratory testing.    MEROPENEM Value in next row Sensitive      <=4 SENSITIVEThis is a modified FDA-approved test  that has been validated and its performance characteristics determined by the reporting laboratory.  This laboratory is certified under the Clinical Laboratory Improvement Amendments CLIA as qualified to perform high complexity clinical laboratory testing.    * >=100,000 COLONIES/mL ESCHERICHIA COLI  Blood Culture ID Panel (Reflexed)     Status: Abnormal   Collection Time: 08/13/24  4:45 AM  Result Value Ref Range Status   Enterococcus faecalis NOT DETECTED NOT DETECTED Final   Enterococcus Faecium NOT DETECTED NOT DETECTED Final   Listeria monocytogenes NOT DETECTED NOT DETECTED Final   Staphylococcus species NOT DETECTED NOT DETECTED Final   Staphylococcus aureus (BCID) NOT DETECTED NOT DETECTED Final   Staphylococcus epidermidis NOT DETECTED NOT DETECTED Final   Staphylococcus lugdunensis NOT DETECTED NOT DETECTED Final   Streptococcus species NOT DETECTED NOT DETECTED Final   Streptococcus agalactiae NOT DETECTED NOT DETECTED Final   Streptococcus pneumoniae NOT DETECTED NOT DETECTED Final   Streptococcus pyogenes NOT DETECTED NOT DETECTED Final   A.calcoaceticus-baumannii NOT DETECTED NOT DETECTED Final   Bacteroides fragilis NOT DETECTED NOT DETECTED Final   Enterobacterales DETECTED (A) NOT DETECTED Final    Comment: Enterobacterales represent a large order of gram negative bacteria, not a single organism. CRITICAL RESULT CALLED TO, READ BACK BY AND VERIFIED WITH:   MITCHELL PHARMD 08/14/2024 @0100  BY DD    Enterobacter cloacae complex NOT DETECTED NOT DETECTED Final   Escherichia coli DETECTED (A) NOT DETECTED Final    Comment: CRITICAL RESULT CALLED TO, READ BACK BY AND VERIFIED WITH:   MITCHELL PHARMD 08/14/2024 @0100  BY DD    Klebsiella aerogenes NOT DETECTED NOT DETECTED Final   Klebsiella oxytoca NOT DETECTED NOT DETECTED Final   Klebsiella pneumoniae NOT DETECTED NOT DETECTED Final   Proteus species NOT DETECTED NOT DETECTED Final   Salmonella species NOT DETECTED NOT  DETECTED Final   Serratia marcescens NOT DETECTED NOT DETECTED Final   Haemophilus influenzae NOT DETECTED NOT DETECTED Final   Neisseria meningitidis NOT DETECTED NOT DETECTED Final   Pseudomonas aeruginosa NOT DETECTED NOT DETECTED Final   Stenotrophomonas maltophilia NOT DETECTED NOT DETECTED Final   Candida albicans NOT DETECTED NOT DETECTED Final   Candida auris NOT DETECTED NOT DETECTED Final   Candida glabrata NOT DETECTED NOT DETECTED Final   Candida krusei NOT DETECTED NOT DETECTED Final   Candida parapsilosis NOT DETECTED NOT DETECTED Final   Candida tropicalis NOT DETECTED NOT DETECTED Final   Cryptococcus neoformans/gattii NOT DETECTED NOT DETECTED Final   CTX-M ESBL NOT DETECTED NOT DETECTED Final   Carbapenem resistance IMP NOT DETECTED NOT DETECTED Final   Carbapenem resistance KPC NOT DETECTED NOT DETECTED Final   Carbapenem resistance NDM NOT DETECTED NOT DETECTED Final   Carbapenem resist OXA 48 LIKE NOT DETECTED NOT DETECTED Final   Carbapenem resistance VIM NOT DETECTED NOT DETECTED Final    Comment: Performed at [facility], [address] [place], [place] [address]     Labs: BNP (last 3 results) No results for input(s): BNP in the last 8760 hours. Basic Metabolic Panel: Recent Labs  Lab 08/13/24 0445 08/14/24 0513 08/15/24 0534 08/16/24 0443  NA 136 136 136 135  K 3.9 4.9 4.2 4.0  CL 101 102 103 103  CO2 24 23  23 22  GLUCOSE 117* 172* 112* 87  BUN 26* 32* 38* 32*  CREATININE 2.09* 2.01* 1.80* 1.57*  CALCIUM 9.6 9.0 9.0 9.0  MG  --   --  2.3 2.0   Liver Function Tests: Recent Labs  Lab 08/13/24 0445  AST 17  ALT 15  ALKPHOS 95  BILITOT 1.0  PROT 7.1  ALBUMIN 3.8   No results for input(s): LIPASE, AMYLASE in the last 168 hours. No results for input(s): AMMONIA in the last 168 hours. CBC: Recent Labs  Lab 08/13/24 0445 08/14/24 0513 08/15/24 0534 08/16/24 0443  WBC 10.3 13.2* 11.8* 8.3  NEUTROABS 10.0*  --  9.7* 5.8   HGB 12.3* 11.0* 10.8* 10.7*  HCT 40.5 36.8* 35.4* 34.4*  MCV 93.3 97.1 98.1 95.0  PLT 197 229 233 228   Cardiac Enzymes: No results for input(s): CKTOTAL, CKMB, CKMBINDEX, TROPONINI in the last 168 hours. BNP: Invalid input(s): POCBNP CBG: No results for input(s): GLUCAP in the last 168 hours. D-Dimer No results for input(s): DDIMER in the last 72 hours. Hgb A1c No results for input(s): HGBA1C in the last 72 hours. Lipid Profile No results for input(s): CHOL, HDL, LDLCALC, TRIG, CHOLHDL, LDLDIRECT in the last 72 hours. Thyroid function studies No results for input(s): TSH, T4TOTAL, T3FREE, THYROIDAB in the last 72 hours.  Invalid input(s): FREET3 Anemia work up No results for input(s): VITAMINB12, FOLATE, FERRITIN, TIBC, IRON, RETICCTPCT in the last 72 hours. Urinalysis    Component Value Date/Time   COLORURINE YELLOW 08/13/2024 0445   APPEARANCEUR HAZY (A) 08/13/2024 0445   LABSPEC 1.016 08/13/2024 0445   PHURINE 5.0 08/13/2024 0445   GLUCOSEU NEGATIVE 08/13/2024 0445   GLUCOSEU NEGATIVE 08/22/2023 0913   HGBUR SMALL (A) 08/13/2024 0445   BILIRUBINUR NEGATIVE 08/13/2024 0445   BILIRUBINUR negative 07/09/2024 1356   KETONESUR NEGATIVE 08/13/2024 0445   PROTEINUR 30 (A) 08/13/2024 0445   UROBILINOGEN 0.2 07/09/2024 1356   UROBILINOGEN 0.2 08/22/2023 0913   NITRITE POSITIVE (A) 08/13/2024 0445   LEUKOCYTESUR MODERATE (A) 08/13/2024 0445   Sepsis Labs Recent Labs  Lab 08/13/24 0445 08/14/24 0513 08/15/24 0534 08/16/24 0443  WBC 10.3 13.2* 11.8* 8.3   Microbiology Recent Results (from the past 240 hours)  Resp panel by RT-PCR (RSV, Flu A&B, Covid) Anterior Nasal Swab     Status: None   Collection Time: 08/13/24  4:45 AM   Specimen: Anterior Nasal Swab  Result Value Ref Range Status   SARS Coronavirus 2 by RT PCR NEGATIVE NEGATIVE Final    Comment: (NOTE) SARS-CoV-2 target nucleic acids are NOT DETECTED.  The  SARS-CoV-2 RNA is generally detectable in upper respiratory specimens during the acute phase of infection. The lowest concentration of SARS-CoV-2 viral copies this assay can detect is 138 copies/mL. A negative result does not preclude SARS-Cov-2 infection and should not be used as the sole basis for treatment or other patient management decisions. A negative result may occur with  improper specimen collection/handling, submission of specimen other than nasopharyngeal swab, presence of viral mutation(s) within the areas targeted by this assay, and inadequate number of viral copies(<138 copies/mL). A negative result must be combined with clinical observations, patient history, and epidemiological information. The expected result is Negative.  Fact Sheet for Patients:  bloggercourse.com  Fact Sheet for Healthcare Providers:  seriousbroker.it  This test is no t yet approved or cleared by the [place]  FDA and  has been authorized for detection and/or diagnosis of SARS-CoV-2 by FDA under an  Emergency Use Authorization (EUA). This EUA will remain  in effect (meaning this test can be used) for the duration of the COVID-19 declaration under Section 564(b)(1) of the Act, 21 U.S.C.section 360bbb-3(b)(1), unless the authorization is terminated  or revoked sooner.       Influenza A by PCR NEGATIVE NEGATIVE Final   Influenza B by PCR NEGATIVE NEGATIVE Final    Comment: (NOTE) The Xpert Xpress SARS-CoV-2/FLU/RSV plus assay is intended as an aid in the diagnosis of influenza from Nasopharyngeal swab specimens and should not be used as a sole basis for treatment. Nasal washings and aspirates are unacceptable for Xpert Xpress SARS-CoV-2/FLU/RSV testing.  Fact Sheet for Patients: bloggercourse.com  Fact Sheet for Healthcare Providers: seriousbroker.it  This test is not yet approved or  cleared by the [place]  FDA and has been authorized for detection and/or diagnosis of SARS-CoV-2 by FDA under an Emergency Use Authorization (EUA). This EUA will remain in effect (meaning this test can be used) for the duration of the COVID-19 declaration under Section 564(b)(1) of the Act, 21 U.S.C. section 360bbb-3(b)(1), unless the authorization is terminated or revoked.     Resp Syncytial Virus by PCR NEGATIVE NEGATIVE Final    Comment: (NOTE) Fact Sheet for Patients: bloggercourse.com  Fact Sheet for Healthcare Providers: seriousbroker.it  This test is not yet approved or cleared by the [place]  FDA and has been authorized for detection and/or diagnosis of SARS-CoV-2 by FDA under an Emergency Use Authorization (EUA). This EUA will remain in effect (meaning this test can be used) for the duration of the COVID-19 declaration under Section 564(b)(1) of the Act, 21 U.S.C. section 360bbb-3(b)(1), unless the authorization is terminated or revoked.  Performed at [facility], [address], [place], [place] [address]   Blood culture (routine x 2)     Status: Abnormal   Collection Time: 08/13/24  4:45 AM   Specimen: BLOOD  Result Value Ref Range Status   Specimen Description   Final    BLOOD BLOOD RIGHT ARM Performed at [facility], [address], [place], [place] [address]    Special Requests   Final    Blood Culture adequate volume BOTTLES DRAWN AEROBIC AND ANAEROBIC Performed at [facility], [address], [place], [place] [address]    Culture  Setup Time   Final    GRAM NEGATIVE RODS IN BOTH AEROBIC AND ANAEROBIC BOTTLES   MITCHELL PHARMD 08/14/2024 @0100  BY DD Performed at [facility], [address] [place], [place] [address]    Culture ESCHERICHIA COLI (A)  Final   Report Status 08/15/2024 FINAL  Final   Organism ID, Bacteria ESCHERICHIA  COLI  Final      Susceptibility   Escherichia coli - MIC*    AMPICILLIN <=2 SENSITIVE Sensitive     CEFAZOLIN (NON-URINE) <=1 SENSITIVE Sensitive     CEFEPIME <=0.12 SENSITIVE Sensitive     ERTAPENEM <=0.12 SENSITIVE Sensitive     CEFTRIAXONE <=0.25 SENSITIVE Sensitive     CIPROFLOXACIN <=0.06 SENSITIVE Sensitive     GENTAMICIN <=1 SENSITIVE Sensitive     MEROPENEM <=0.25 SENSITIVE Sensitive     TRIMETH/SULFA <=20 SENSITIVE Sensitive     AMPICILLIN/SULBACTAM <=2 SENSITIVE Sensitive     PIP/TAZO Value in next row Sensitive      <=4 SENSITIVEThis is a modified FDA-approved test that has been validated and its performance characteristics determined by the reporting laboratory.  This laboratory is certified under the Clinical  Laboratory Improvement Amendments CLIA as qualified to perform high complexity clinical laboratory testing.    * ESCHERICHIA COLI  Blood culture (routine x 2)     Status: Abnormal   Collection Time: 08/13/24  4:45 AM   Specimen: BLOOD  Result Value Ref Range Status   Specimen Description   Final    BLOOD BLOOD LEFT FOREARM Performed at [facility], [address], [place], [place] [address]    Special Requests   Final    Blood Culture adequate volume BOTTLES DRAWN AEROBIC ONLY Performed at [facility], [address] [place], [place] [address]    Culture  Setup Time   Final    GRAM NEGATIVE RODS AEROBIC BOTTLE ONLY CRITICAL VALUE NOTED.  VALUE IS CONSISTENT WITH PREVIOUSLY REPORTED AND CALLED VALUE. Performed at [facility], [address] [place], [place] [address]    Culture ESCHERICHIA COLI (A)  Final   Report Status 08/16/2024 FINAL  Final  Urine Culture     Status: Abnormal   Collection Time: 08/13/24  4:45 AM   Specimen: Urine, Random  Result Value Ref Range Status   Specimen Description   Final    URINE, RANDOM Performed at [facility], [address] [place], [place] [address]     Special Requests   Final    NONE Reflexed from [phone] Performed at [facility], [address] [place], [place] [address]    Culture >=100,000 COLONIES/mL ESCHERICHIA COLI (A)  Final   Report Status 08/15/2024 FINAL  Final   Organism ID, Bacteria ESCHERICHIA COLI (A)  Final      Susceptibility   Escherichia coli - MIC*    AMPICILLIN <=2 SENSITIVE Sensitive     CEFAZOLIN (URINE) Value in next row Sensitive      <=1 SENSITIVEThis is a modified FDA-approved test that has been validated and its performance characteristics determined by the reporting laboratory.  This laboratory is certified under the Clinical Laboratory Improvement Amendments CLIA as qualified to perform high complexity clinical laboratory testing.    CEFEPIME Value in next row Sensitive      <=1 SENSITIVEThis is a modified FDA-approved test that has been validated and its performance characteristics determined by the reporting laboratory.  This laboratory is certified under the Clinical Laboratory Improvement Amendments CLIA as qualified to perform high complexity clinical laboratory testing.    ERTAPENEM Value in next row Sensitive      <=1 SENSITIVEThis is a modified FDA-approved test that has been validated and its performance characteristics determined by the reporting laboratory.  This laboratory is certified under the Clinical Laboratory Improvement Amendments CLIA as qualified to perform high complexity clinical laboratory testing.    CEFTRIAXONE Value in next row Sensitive      <=1 SENSITIVEThis is a modified FDA-approved test that has been validated and its performance characteristics determined by the reporting laboratory.  This laboratory is certified under the Clinical Laboratory Improvement Amendments CLIA as qualified to perform high complexity clinical laboratory testing.    CIPROFLOXACIN Value in next row Sensitive      <=1 SENSITIVEThis is a modified FDA-approved test that has been validated  and its performance characteristics determined by the reporting laboratory.  This laboratory is certified under the Clinical Laboratory Improvement Amendments CLIA as qualified to perform high complexity clinical laboratory testing.    GENTAMICIN Value in next row Sensitive      <=1 SENSITIVEThis is a modified FDA-approved test that  has been validated and its performance characteristics determined by the reporting laboratory.  This laboratory is certified under the Clinical Laboratory Improvement Amendments CLIA as qualified to perform high complexity clinical laboratory testing.    NITROFURANTOIN Value in next row Sensitive      <=1 SENSITIVEThis is a modified FDA-approved test that has been validated and its performance characteristics determined by the reporting laboratory.  This laboratory is certified under the Clinical Laboratory Improvement Amendments CLIA as qualified to perform high complexity clinical laboratory testing.    TRIMETH/SULFA Value in next row Sensitive      <=1 SENSITIVEThis is a modified FDA-approved test that has been validated and its performance characteristics determined by the reporting laboratory.  This laboratory is certified under the Clinical Laboratory Improvement Amendments CLIA as qualified to perform high complexity clinical laboratory testing.    AMPICILLIN/SULBACTAM Value in next row Sensitive      <=1 SENSITIVEThis is a modified FDA-approved test that has been validated and its performance characteristics determined by the reporting laboratory.  This laboratory is certified under the Clinical Laboratory Improvement Amendments CLIA as qualified to perform high complexity clinical laboratory testing.    PIP/TAZO Value in next row Sensitive      <=4 SENSITIVEThis is a modified FDA-approved test that has been validated and its performance characteristics determined by the reporting laboratory.  This laboratory is certified under the Clinical Laboratory Improvement  Amendments CLIA as qualified to perform high complexity clinical laboratory testing.    MEROPENEM Value in next row Sensitive      <=4 SENSITIVEThis is a modified FDA-approved test that has been validated and its performance characteristics determined by the reporting laboratory.  This laboratory is certified under the Clinical Laboratory Improvement Amendments CLIA as qualified to perform high complexity clinical laboratory testing.    * >=100,000 COLONIES/mL ESCHERICHIA COLI  Blood Culture ID Panel (Reflexed)     Status: Abnormal   Collection Time: 08/13/24  4:45 AM  Result Value Ref Range Status   Enterococcus faecalis NOT DETECTED NOT DETECTED Final   Enterococcus Faecium NOT DETECTED NOT DETECTED Final   Listeria monocytogenes NOT DETECTED NOT DETECTED Final   Staphylococcus species NOT DETECTED NOT DETECTED Final   Staphylococcus aureus (BCID) NOT DETECTED NOT DETECTED Final   Staphylococcus epidermidis NOT DETECTED NOT DETECTED Final   Staphylococcus lugdunensis NOT DETECTED NOT DETECTED Final   Streptococcus species NOT DETECTED NOT DETECTED Final   Streptococcus agalactiae NOT DETECTED NOT DETECTED Final   Streptococcus pneumoniae NOT DETECTED NOT DETECTED Final   Streptococcus pyogenes NOT DETECTED NOT DETECTED Final   A.calcoaceticus-baumannii NOT DETECTED NOT DETECTED Final   Bacteroides fragilis NOT DETECTED NOT DETECTED Final   Enterobacterales DETECTED (A) NOT DETECTED Final    Comment: Enterobacterales represent a large order of gram negative bacteria, not a single organism. CRITICAL RESULT CALLED TO, READ BACK BY AND VERIFIED WITH:   MITCHELL PHARMD 08/14/2024 @0100  BY DD    Enterobacter cloacae complex NOT DETECTED NOT DETECTED Final   Escherichia coli DETECTED (A) NOT DETECTED Final    Comment: CRITICAL RESULT CALLED TO, READ BACK BY AND VERIFIED WITH:   MITCHELL PHARMD 08/14/2024 @0100  BY DD    Klebsiella aerogenes NOT DETECTED NOT DETECTED Final   Klebsiella  oxytoca NOT DETECTED NOT DETECTED Final   Klebsiella pneumoniae NOT DETECTED NOT DETECTED Final   Proteus species NOT DETECTED NOT DETECTED Final   Salmonella species NOT DETECTED NOT DETECTED Final   Serratia marcescens NOT DETECTED NOT DETECTED  Final   Haemophilus influenzae NOT DETECTED NOT DETECTED Final   Neisseria meningitidis NOT DETECTED NOT DETECTED Final   Pseudomonas aeruginosa NOT DETECTED NOT DETECTED Final   Stenotrophomonas maltophilia NOT DETECTED NOT DETECTED Final   Candida albicans NOT DETECTED NOT DETECTED Final   Candida auris NOT DETECTED NOT DETECTED Final   Candida glabrata NOT DETECTED NOT DETECTED Final   Candida krusei NOT DETECTED NOT DETECTED Final   Candida parapsilosis NOT DETECTED NOT DETECTED Final   Candida tropicalis NOT DETECTED NOT DETECTED Final   Cryptococcus neoformans/gattii NOT DETECTED NOT DETECTED Final   CTX-M ESBL NOT DETECTED NOT DETECTED Final   Carbapenem resistance IMP NOT DETECTED NOT DETECTED Final   Carbapenem resistance KPC NOT DETECTED NOT DETECTED Final   Carbapenem resistance NDM NOT DETECTED NOT DETECTED Final   Carbapenem resist OXA 48 LIKE NOT DETECTED NOT DETECTED Final   Carbapenem resistance VIM NOT DETECTED NOT DETECTED Final    Comment: Performed at [facility], [address] [place], [place] [address]    Procedures/Studies: MR ABDOMEN W WO CONTRAST Result Date: 08/15/2024 EXAM: MRCP WITH AND WITHOUT IV CONTRAST 08/14/2024 06:50:01 PM TECHNIQUE: Multisequence, multiplanar magnetic resonance images of the abdomen with and without intravenous contrast. MRCP sequences were performed. CONTRAST: 10 mL Gadavist. COMPARISON: CT findings from the previous day. CLINICAL HISTORY: Renal mass protocol, evaluation of right renal masses. FINDINGS: LIVER: Hepatic steatosis. No suspicious liver lesion. GALLBLADDER AND BILIARY SYSTEM: Status post cholecystectomy. Mixed signal intensity within the gallbladder fossa consistent with  postoperative change. Percutaneous drainage catheter is identified terminating within the gallbladder fossa and entering from a right lateral abdominal approach. No bile duct dilatation identified. SPLEEN: The spleen is within normal limits in size and appearance. PANCREAS/PANCREATIC DUCT: The pancreas is normal in size and contour without a focal lesion or ductal dilatation. ADRENAL GLANDS: Normal size and morphology bilaterally. No nodule, thickening, or hemorrhage. No periadrenal stranding. KIDNEYS: Bilateral kidneys are obscured by motion artifact. Bilateral renal cortical scarring and mild right renal atrophy noted. Multiple exophytic uniformly T2 hyperintense right kidney lesions are noted corresponding to the CT findings from the previous day. - bosniak class 1 cyst off the upper pole of the right kidney measures 2.8 cm (image 42/11). No convincing evidence of enhancement within this lesion on the postcontrast images. - bosniak class 2 cyst off the inferior pole of the right kidney is also T2 hyperintense with mild intrinsic increased T1 signal without signs of internal enhancement. -Adjacent right lower pole 1.2 cm uniformly T1 hyperintense and T2 isointense exophytic lesion without signs of internal enhancement is compatible with a Bosniak class 1 cyst. - nodule Lastly, there is an exophytic lesion off the anterior cortex of the interpolar right kidney measuring 0.7 cm which is T1 and T2 isointense and technically too small to reliably characterize (Bosniak class 2 cyst). No suspicious enhancing kidney lesions identified bilaterally. LYMPH NODES: No enlarged abdominal lymph nodes. VASCULATURE: Unremarkable. PERITONEUM: No ascites. ABDOMINAL WALL: No hernia. No mass. BOWEL: Grossly unremarkable. No bowel obstruction. BONES: No acute abnormality or worrisome osseous lesion. SOFT TISSUES: Unremarkable. MISCELLANEOUS: Atelectasis identified within both lung bases, likely related to recent cholecystectomy.  Motion artifact diminishes exam detail. IMPRESSION: 1. No suspicious enhancing renal mass identified bilaterally. 2. Right kidney Bosniak class 1 and 2 kidney cysts identified. According to consensus guidelines, no follow-up imaging is recommended for Bosniak class 1 and 2 kidney cysts. 3. Status post cholecystectomy with percutaneous drainage catheter terminating in the gallbladder  fossa. Electronically signed by: Waddell Calk MD 08/15/2024 07:17 AM EST RP Workstation: [id number]   CT Renal Stone Study Result Date: 08/13/2024 EXAM: CT UROGRAM 08/13/2024 06:25:00 AM TECHNIQUE: CT of the abdomen and pelvis was performed without the administration of intravenous contrast as per CT urogram protocol. Multiplanar reformatted images as well as MIP urogram images are provided for review. Automated exposure control, iterative reconstruction, and/or weight based adjustment of the mA/kV was utilized to reduce the radiation dose to as low as reasonably achievable. COMPARISON: 03/23/2006 CLINICAL HISTORY: FINDINGS: LOWER CHEST: Dependent changes noted in the posterior lung bases. Lingular and posterior right lower lobe scarring. LIVER: The liver is unremarkable. GALLBLADDER AND BILE DUCTS: Multiple tiny stones and sludge noted within the gallbladder. Gallbladder wall appears mildly thickened with diffuse pericholecystic soft tissue stranding. No biliary ductal dilatation. SPLEEN: The spleen is within normal limits in size and appearance. PANCREAS: The pancreas is normal in size and contour without focal lesion or ductal dilatation. ADRENAL GLANDS: No acute abnormality. KIDNEYS, URETERS AND BLADDER: Bilateral renal cortical scarring with progressive right renal cortical atrophy. Punctate stone noted within the interpolar right kidney, image 38/2. There are multiple indeterminate lesions arising off the right kidney. Index lesion off the upper pole measures 3.2 x 2.7 cm and 23 Hounsfield units. Off the inferior pole of the  right kidney, there is a 2.6 x 2.3 cm exophytic lesion measuring 30 Hounsfield units. No stones in the ureters. No hydronephrosis. No perinephric or periureteral stranding. A 2.4 cm diverticula arises off the right posterior bladder wall, image 77/2. GI AND BOWEL: Stomach demonstrates no acute abnormality. The appendix is visualized and normal in caliber, without wall thickening, periappendiceal inflammation, or fluid. Sigmoid colon demonstrates diverticulosis without evidence of diverticulitis. No bowel wall thickening, pericolonic stranding, abscess, or free air. There is no bowel obstruction. PERITONEUM AND RETROPERITONEUM: No ascites. No free air. Trace fluid within the right posterior pelvis. VASCULATURE: Aorta is normal in caliber. Aortic atherosclerotic calcifications. LYMPH NODES: No lymphadenopathy. REPRODUCTIVE ORGANS: Normal prostate gland. BONES AND SOFT TISSUES: Multilevel lumbar spondylosis. Bilateral L5 pars defects with first degree anterolisthesis of L5 on S1. No focal soft tissue abnormality. IMPRESSION: 1. Findings suspicious for acute cholecystitis given gallbladder calculi/sludge with mild wall thickening and pericholecystic stranding. Surgical consultation is recommended. 2. Indeterminate right renal masses measuring 3.2 x 2.7 cm (23 HU) at the upper pole and 2.6 x 2.3 cm (30 HU) at the inferior pole. Recommend Non-emergent renal protocol MRI or CT without and with contrast for characterization. 3. Right renal interpolar punctate nephrolithiasis. 4. Right posterior bladder wall diverticulum measuring 2.4 cm. 5. Aortic atherosclerotic calcifications. Electronically signed by: Waddell Calk MD 08/13/2024 06:36 AM EST RP Workstation: [id number]   DG Chest Port 1 View Result Date: 08/13/2024 EXAM: 1 VIEW(S) XRAY OF THE CHEST 08/13/2024 04:58:00 AM COMPARISON: Chest radiographs 02/14/2012. CLINICAL HISTORY: 63 year old male. SOB. FINDINGS: LUNGS AND PLEURA: Low lung volumes, similar to the prior.  When allowing for portable technique, the lungs appear clear. No focal pulmonary opacity. No pulmonary edema. No pleural effusion. No pneumothorax. HEART AND MEDIASTINUM: No acute abnormality of the cardiac and mediastinal silhouettes. BONES AND SOFT TISSUES: No acute osseous abnormality. IMPRESSION: 1. No acute cardiopulmonary abnormality. Electronically signed by: Helayne Hurst MD 08/13/2024 05:42 AM EST RP Workstation: [id number]     Time coordinating discharge: Over 30 minutes    Alm Apo, MD  [facility] 08/16/2024, 5:09 PM

## 2024-08-16 NOTE — Progress Notes (Signed)
 Discharge meds in a secure bag delivered to room by this RN

## 2024-08-17 LAB — SURGICAL PATHOLOGY

## 2024-08-29 ENCOUNTER — Other Ambulatory Visit: Payer: Self-pay | Admitting: Internal Medicine

## 2024-09-03 ENCOUNTER — Encounter: Payer: PRIVATE HEALTH INSURANCE | Admitting: Internal Medicine

## 2024-09-05 ENCOUNTER — Other Ambulatory Visit: Payer: Self-pay | Admitting: Internal Medicine

## 2024-09-10 ENCOUNTER — Encounter: Payer: PRIVATE HEALTH INSURANCE | Admitting: Internal Medicine

## 2024-10-15 ENCOUNTER — Ambulatory Visit: Payer: PRIVATE HEALTH INSURANCE | Admitting: Internal Medicine

## 2024-10-15 ENCOUNTER — Encounter: Payer: Self-pay | Admitting: Internal Medicine

## 2024-10-15 VITALS — BP 110/70 | HR 58 | Temp 97.9°F | Ht 66.0 in | Wt 235.0 lb

## 2024-10-15 DIAGNOSIS — E782 Mixed hyperlipidemia: Secondary | ICD-10-CM | POA: Diagnosis not present

## 2024-10-15 DIAGNOSIS — I10 Essential (primary) hypertension: Secondary | ICD-10-CM

## 2024-10-15 DIAGNOSIS — M1A00X0 Idiopathic chronic gout, unspecified site, without tophus (tophi): Secondary | ICD-10-CM

## 2024-10-15 DIAGNOSIS — R7301 Impaired fasting glucose: Secondary | ICD-10-CM | POA: Diagnosis not present

## 2024-10-15 DIAGNOSIS — Z0000 Encounter for general adult medical examination without abnormal findings: Secondary | ICD-10-CM

## 2024-10-15 DIAGNOSIS — N1831 Chronic kidney disease, stage 3a: Secondary | ICD-10-CM | POA: Diagnosis not present

## 2024-10-15 LAB — COMPREHENSIVE METABOLIC PANEL WITH GFR
ALT: 19 U/L (ref 3–53)
AST: 18 U/L (ref 5–37)
Albumin: 4.1 g/dL (ref 3.5–5.2)
Alkaline Phosphatase: 46 U/L (ref 39–117)
BUN: 27 mg/dL — ABNORMAL HIGH (ref 6–23)
CO2: 28 meq/L (ref 19–32)
Calcium: 9.7 mg/dL (ref 8.4–10.5)
Chloride: 105 meq/L (ref 96–112)
Creatinine, Ser: 1.51 mg/dL — ABNORMAL HIGH (ref 0.40–1.50)
GFR: 48.1 mL/min — ABNORMAL LOW
Glucose, Bld: 108 mg/dL — ABNORMAL HIGH (ref 70–99)
Potassium: 4.4 meq/L (ref 3.5–5.1)
Sodium: 139 meq/L (ref 135–145)
Total Bilirubin: 0.3 mg/dL (ref 0.2–1.2)
Total Protein: 7.1 g/dL (ref 6.0–8.3)

## 2024-10-15 LAB — LIPID PANEL
Cholesterol: 183 mg/dL (ref 28–200)
HDL: 40.6 mg/dL
LDL Cholesterol: 116 mg/dL — ABNORMAL HIGH (ref 10–99)
NonHDL: 142.07
Total CHOL/HDL Ratio: 4
Triglycerides: 132 mg/dL (ref 10.0–149.0)
VLDL: 26.4 mg/dL (ref 0.0–40.0)

## 2024-10-15 LAB — URIC ACID: Uric Acid, Serum: 5.2 mg/dL (ref 4.0–7.8)

## 2024-10-15 LAB — HEMOGLOBIN A1C: Hgb A1c MFr Bld: 5.8 % (ref 4.6–6.5)

## 2024-10-15 MED ORDER — LISINOPRIL-HYDROCHLOROTHIAZIDE 20-12.5 MG PO TABS: 1.0000 | ORAL_TABLET | Freq: Every day | ORAL | 3 refills | Status: AC

## 2024-10-15 MED ORDER — ALLOPURINOL 300 MG PO TABS: 300.0000 mg | ORAL_TABLET | Freq: Every day | ORAL | 3 refills | Status: AC

## 2024-10-15 MED ORDER — PRAVASTATIN SODIUM 20 MG PO TABS: 20.0000 mg | ORAL_TABLET | Freq: Every day | ORAL | 3 refills | Status: AC

## 2024-10-15 NOTE — Assessment & Plan Note (Signed)
 BMI 37 and complicated by hypertension. Counseled about diet and exercise.

## 2024-10-15 NOTE — Assessment & Plan Note (Signed)
 Checking HgA1c and adjust as needed.

## 2024-10-15 NOTE — Assessment & Plan Note (Signed)
 Flu shot declines. Pneumonia declines. Shingrix  complete. Tetanus up to date. Colonoscopy up to date per patient will get records from [facility]. Counseled about sun safety and mole surveillance. Counseled about the dangers of distracted driving. Given 10 year screening recommendations.

## 2024-10-15 NOTE — Assessment & Plan Note (Signed)
Checking uric acid and adjust allopurinol as needed.

## 2024-10-15 NOTE — Progress Notes (Signed)
" ° °  Subjective:   Patient ID: Terry Cummings, male    DOB: 12/14/1960, 63 y.o.   MRN: [id number]  The patient is here for physical. Pertinent topics discussed: Discussed the use of AI scribe software for clinical note transcription with the patient, who gave verbal consent to proceed.  History of Present Illness Terry Cummings is a 64 year old male who presents for physical.   During his hospital stay recently for cholecystitis, a CT scan revealed a spot on his kidney, which was later evaluated with an MRI. The MRI was performed to further evaluate the spot seen on the kidney during the CT scan and this was a benign cyst  PMH, [facility], social history reviewed and updated  Review of Systems  Constitutional: Negative.   HENT: Negative.    Eyes: Negative.   Respiratory:  Negative for cough, chest tightness and shortness of breath.   Cardiovascular:  Negative for chest pain, palpitations and leg swelling.  Gastrointestinal:  Negative for abdominal distention, abdominal pain, constipation, diarrhea, nausea and vomiting.  Musculoskeletal: Negative.   Skin: Negative.   Neurological: Negative.   Psychiatric/Behavioral: Negative.      Objective:  Physical Exam Constitutional:      Appearance: He is well-developed.  HENT:     Head: Normocephalic and atraumatic.  Cardiovascular:     Rate and Rhythm: Normal rate and regular rhythm.  Pulmonary:     Effort: Pulmonary effort is normal. No respiratory distress.     Breath sounds: Normal breath sounds. No wheezing or rales.  Abdominal:     General: Bowel sounds are normal. There is no distension.     Palpations: Abdomen is soft.     Tenderness: There is no abdominal tenderness.  Musculoskeletal:     Cervical back: Normal range of motion.  Skin:    General: Skin is warm and dry.  Neurological:     Mental Status: He is alert and oriented to person, place, and time.     Coordination: Coordination normal.     Vitals:   10/15/24 0829  BP:  110/70  Pulse: (!) 58  Temp: 97.9 F (36.6 C)  TempSrc: Oral  SpO2: 97%  Weight: 235 lb (106.6 kg)  Height: 5' 6 (1.676 m)    Assessment & Plan:   "

## 2024-10-15 NOTE — Assessment & Plan Note (Signed)
 Checking CMP for stability.

## 2024-10-15 NOTE — Assessment & Plan Note (Signed)
 Checking lipid panel and adjust as needed.

## 2024-10-15 NOTE — Assessment & Plan Note (Signed)
 Checking CMP and BP at goal continue regimen.

## 2024-10-15 NOTE — Progress Notes (Signed)
 "  Chief Complaint  Patient presents with   Annual Exam    Welcome to Medicare Wellness Visit-pt is not having any concerns, declined EKG     Subjective:   Terry Cummings is a 64 y.o. male who presents for a Welcome to Medicare Exam.   Visit info / Clinical Intake: Medicare Wellness Visit Type:: Initial Annual Wellness Visit Persons participating in visit and providing information:: patient Medicare Wellness Visit Mode:: In-person (required for WT[facility]) Interpreter Needed?: No Pre-visit prep was completed: no AWV questionnaire completed by patient prior to visit?: no Living arrangements:: lives with spouse/significant other Patient's Overall Health Status Rating: good Typical amount of pain: none Does pain affect daily life?: no Are you currently prescribed opioids?: no  Dietary Habits and Nutritional Risks How many meals a day?: 3 Eats fruit and vegetables daily?: yes Most meals are obtained by: preparing own meals In the last 2 weeks, have you had any of the following?: none Diabetic:: no  Functional Status Activities of Daily Living (to include ambulation/medication): Independent Ambulation: Independent Medication Administration: Independent Home Management (perform basic housework or laundry): Independent Manage your own finances?: yes Primary transportation is: driving Concerns about hearing?: no  Fall Screening Falls in the past year?: 0 Number of falls in past year: 0 Patient Fall Risk Level: Moderate fall risk  Fall Risk Patient at Risk for Falls Due to: No Fall Risks  Home and Transportation Safety: All rugs have non-skid backing?: yes All stairs or steps have railings?: yes Grab bars in the bathtub or shower?: (!) no Have non-skid surface in bathtub or shower?: yes Good home lighting?: yes Regular seat belt use?: yes Hospital stays in the last year:: (!) yes How many hospital stays:: 1 Reason: Gallbladder Surgery  Cognitive Assessment Difficulty  concentrating, remembering, or making decisions? : no Will 6CIT or Mini Cog be Completed: yes What year is it?: 0 points What month is it?: 0 points Give patient an address phrase to remember (5 components): no ifs ands or buts About what time is it?: 0 points Count backwards from 20 to 1: 0 points Say the months of the year in reverse: 0 points Repeat the address phrase from earlier: 0 points 6 CIT Score: 0 points  Advance Directives (For Healthcare) Does Patient Have a Medical Advance Directive?: Yes Does patient want to make changes to medical advance directive?: Yes (Inpatient - patient defers changing a medical advance directive at this time - Information given) Type of Advance Directive: Healthcare Power of Attorney Would patient like information on creating a medical advance directive?: No - Patient declined    Allergies (verified) Codeine and Neomycin-bacitracin zn-polymyx   Current Medications (verified) Outpatient Encounter Medications as of 10/15/2024  Medication Sig   aspirin 81 MG tablet Take 81 mg by mouth daily.   [DISCONTINUED] allopurinol  (ZYLOPRIM ) 300 MG tablet TAKE 1 TABLET BY MOUTH DAILY   [DISCONTINUED] lisinopril -hydrochlorothiazide  (ZESTORETIC ) 20-12.5 MG tablet TAKE 1 TABLET BY MOUTH DAILY   [DISCONTINUED] oxyCODONE  (OXY IR/ROXICODONE ) 5 MG immediate release tablet Take 1 tablet (5 mg total) by mouth every 4 (four) hours as needed for moderate pain (pain score 4-6) or severe pain (pain score 7-10).   [DISCONTINUED] pravastatin  (PRAVACHOL ) 20 MG tablet TAKE 1 TABLET BY MOUTH DAILY   allopurinol  (ZYLOPRIM ) 300 MG tablet Take 1 tablet (300 mg total) by mouth daily.   lisinopril -hydrochlorothiazide  (ZESTORETIC ) 20-12.5 MG tablet Take 1 tablet by mouth daily.   pravastatin  (PRAVACHOL ) 20 MG tablet Take 1 tablet (20  mg total) by mouth daily.   No facility-administered encounter medications on file as of 10/15/2024.    History: Past Medical History:  Diagnosis  Date   Atypical nevus 07/26/2003   Upper Center Back-Slight to Moderate (w/s)   Backache, unspecified    Hypertension    Obesity, unspecified    Other and unspecified hyperlipidemia    Pain in joint, lower leg    Personal history of urinary calculi    Past Surgical History:  Procedure Laterality Date   CHOLECYSTECTOMY N/A 08/13/2024   Procedure: LAPAROSCOPIC CHOLECYSTECTOMY;  Surgeon: Kinsinger, Herlene Righter, MD;  Location: [facility];  Service: General;  Laterality: N/A;  ICG   excision of prepatellar burs     IRRIGATION AND DEBRIDEMENT, open, of infected prepatellar bursa     TONSILLECTOMY     Family History  Problem Relation Age of Onset   Alcohol abuse Father    Cirrhosis Father    Brain cancer Father    Social History   Occupational History   Occupation: welder  Tobacco Use   Smoking status: Never   Smokeless tobacco: Current    Types: Chew  Vaping Use   Vaping status: Never Used  Substance and Sexual Activity   Alcohol use: No   Drug use: No   Sexual activity: Not on file   Tobacco Counseling Ready to quit: Not Answered Counseling given: Not Answered  SDOH Screenings   Food Insecurity: No Food Insecurity (10/15/2024)  Housing: Unknown (10/15/2024)  Transportation Needs: No Transportation Needs (10/15/2024)  Utilities: Not At Risk (10/15/2024)  Depression (PHQ2-9): Low Risk (10/15/2024)  Physical Activity: Sufficiently Active (10/15/2024)  Social Connections: Moderately Integrated (10/15/2024)  Stress: No Stress Concern Present (10/15/2024)  Tobacco Use: High Risk (10/15/2024)  Health Literacy: Adequate Health Literacy (10/15/2024)   See flowsheets for full screening details  Depression Screen PHQ 2 & 9 Depression Scale- Over the past 2 weeks, how often have you been bothered by any of the following problems? Little interest or pleasure in doing things: 0 Feeling down, depressed, or hopeless (PHQ Adolescent also includes...irritable): 0 PHQ-2 Total Score: 0      Goals  Addressed   None          Objective:    Today's Vitals   10/15/24 0829  BP: 110/70  Pulse: (!) 58  Temp: 97.9 F (36.6 C)  TempSrc: Oral  SpO2: 97%  Weight: 235 lb (106.6 kg)  Height: 5' 6 (1.676 m)   Body mass index is 37.93 kg/m.   Physical Exam See other note   Hearing/Vision screen Vision Screening   Right eye Left eye Both eyes  Without correction 20/40 20/25 20/20   With correction     Hearing Screening - Comments:: Normal bilaterally to whisper  Immunizations and Health Maintenance Health Maintenance  Topic Date Due   Medicare Annual Wellness (AWV)  Never done   Pneumococcal Vaccine: 50+ Years (1 of 2 - PCV) Never done   COVID-19 Vaccine (3 - 2025-26 season) 06/07/2024   Colonoscopy  08/20/2024   Influenza Vaccine  01/04/2025 (Originally 05/07/2024)   DTaP/Tdap/Td (3 - Td or Tdap) 08/21/2033   Hepatitis C Screening  Completed   HIV Screening  Completed   Zoster Vaccines- Shingrix   Completed   Hepatitis B Vaccines 19-59 Average Risk  Aged Out   Meningococcal B Vaccine  Aged Out    EKG: declined     Assessment/Plan:  This is a routine wellness examination for Rawad.  Patient Care Team: Rollene,  Almarie LABOR, MD as PCP - General (Internal Medicine)  I have personally reviewed and noted the following in the patients chart:   Medical and social history Use of alcohol, tobacco or illicit drugs  Current medications and supplements including opioid prescriptions. Functional ability and status Nutritional status Physical activity Advanced directives List of other physicians Hospitalizations, surgeries, and ER visits in previous 12 months Vitals Screenings to include cognitive, depression, and falls Referrals and appointments  Orders Placed This Encounter  Procedures   Uric acid   Lipid panel   Hemoglobin A1c   Comprehensive metabolic panel with GFR   In addition, I have reviewed and discussed with patient certain preventive protocols, quality  metrics, and best practice recommendations. A written personalized care plan for preventive services as well as general preventive health recommendations were provided to patient.   Almarie LABOR [place], MD   10/15/2024   Return in about 1 year (around 10/15/2025).  "

## 2024-10-15 NOTE — Patient Instructions (Signed)
 Mr. Manke,  Thank you for taking the time for your Medicare Wellness Visit. I appreciate your continued commitment to your health goals. Please review the care plan we discussed, and feel free to reach out if I can assist you further.  Please note that Annual Wellness Visits do not include a physical exam. Some assessments may be limited, especially if the visit was conducted virtually. If needed, we may recommend an in-person follow-up with your provider.  Ongoing Care Seeing your primary care provider every 3 to 6 months helps us  monitor your health and provide consistent, personalized care.   Referrals If a referral was made during today's visit and you haven't received any updates within two weeks, please contact the referred provider directly to check on the status.  Recommended Screenings:  Health Maintenance  Topic Date Due   Medicare Annual Wellness Visit  Never done   Pneumococcal Vaccine for age over 58 (1 of 2 - PCV) Never done   COVID-19 Vaccine (3 - 2025-26 season) 06/07/2024   Colon Cancer Screening  08/20/2024   Flu Shot  01/04/2025*   DTaP/Tdap/Td vaccine (3 - Td or Tdap) 08/21/2033   Hepatitis C Screening  Completed   HIV Screening  Completed   Zoster (Shingles) Vaccine  Completed   Hepatitis B Vaccine  Aged Out   Meningitis B Vaccine  Aged Out  *Topic was postponed. The date shown is not the original due date.       10/15/2024    8:13 AM  Advanced Directives  Does Patient Have a Medical Advance Directive? Yes  Type of Advance Directive Healthcare Power of Attorney  Does patient want to make changes to medical advance directive? Yes (Inpatient - patient defers changing a medical advance directive at this time - Information given)    Vision: Annual vision screenings are recommended for early detection of glaucoma, cataracts, and diabetic retinopathy. These exams can also reveal signs of chronic conditions such as diabetes and high blood pressure.  Dental:  Annual dental screenings help detect early signs of oral cancer, gum disease, and other conditions linked to overall health, including heart disease and diabetes.  Please see the attached documents for additional preventive care recommendations.

## 2024-10-18 ENCOUNTER — Ambulatory Visit: Payer: Self-pay | Admitting: Internal Medicine

## 2024-10-22 ENCOUNTER — Other Ambulatory Visit (HOSPITAL_COMMUNITY): Payer: Self-pay

## 2025-10-21 ENCOUNTER — Encounter: Admitting: Internal Medicine
